# Patient Record
Sex: Female | Born: 1945 | Race: Black or African American | Hispanic: No | Smoking: Never smoker
Health system: Southern US, Community
[De-identification: ages and names within clinical notes are randomized; demographics above are authoritative.]

## PROBLEM LIST (undated history)

## (undated) DIAGNOSIS — D649 Anemia, unspecified: Secondary | ICD-10-CM

## (undated) DIAGNOSIS — T8859XA Other complications of anesthesia, initial encounter: Secondary | ICD-10-CM

## (undated) DIAGNOSIS — K219 Gastro-esophageal reflux disease without esophagitis: Secondary | ICD-10-CM

## (undated) DIAGNOSIS — I10 Essential (primary) hypertension: Secondary | ICD-10-CM

## (undated) DIAGNOSIS — R011 Cardiac murmur, unspecified: Secondary | ICD-10-CM

## (undated) DIAGNOSIS — Z9889 Other specified postprocedural states: Secondary | ICD-10-CM

## (undated) DIAGNOSIS — Z8489 Family history of other specified conditions: Secondary | ICD-10-CM

## (undated) DIAGNOSIS — T4145XA Adverse effect of unspecified anesthetic, initial encounter: Secondary | ICD-10-CM

## (undated) DIAGNOSIS — R112 Nausea with vomiting, unspecified: Secondary | ICD-10-CM

## (undated) HISTORY — PX: KNEE ARTHROPLASTY: SHX992

## (undated) HISTORY — PX: COLONOSCOPY: SHX174

## (undated) HISTORY — PX: HAND SURGERY: SHX662

## (undated) HISTORY — PX: SHOULDER SURGERY: SHX246

---

## 2010-10-08 ENCOUNTER — Encounter (HOSPITAL_COMMUNITY)
Admission: RE | Admit: 2010-10-08 | Discharge: 2010-10-08 | Disposition: A | Discharge status: Home / Self Care | Payer: PRIVATE HEALTH INSURANCE | Source: Ambulatory Visit | Attending: Orthopedic Surgery | Admitting: Orthopedic Surgery

## 2010-10-08 ENCOUNTER — Ambulatory Visit (HOSPITAL_COMMUNITY)
Admission: RE | Admit: 2010-10-08 | Discharge: 2010-10-08 | Disposition: A | Discharge status: Home / Self Care | Payer: PRIVATE HEALTH INSURANCE | Source: Ambulatory Visit | Attending: Orthopedic Surgery | Admitting: Orthopedic Surgery

## 2010-10-08 ENCOUNTER — Other Ambulatory Visit (HOSPITAL_COMMUNITY): Payer: Self-pay | Admitting: Orthopedic Surgery

## 2010-10-08 DIAGNOSIS — M1711 Unilateral primary osteoarthritis, right knee: Secondary | ICD-10-CM

## 2010-10-08 DIAGNOSIS — Z01818 Encounter for other preprocedural examination: Secondary | ICD-10-CM | POA: Insufficient documentation

## 2010-10-08 DIAGNOSIS — IMO0002 Reserved for concepts with insufficient information to code with codable children: Secondary | ICD-10-CM | POA: Insufficient documentation

## 2010-10-08 DIAGNOSIS — M1710 Unilateral primary osteoarthritis, unspecified knee: Secondary | ICD-10-CM | POA: Insufficient documentation

## 2010-10-08 DIAGNOSIS — Z01812 Encounter for preprocedural laboratory examination: Secondary | ICD-10-CM | POA: Insufficient documentation

## 2010-10-08 DIAGNOSIS — Z01810 Encounter for preprocedural cardiovascular examination: Secondary | ICD-10-CM | POA: Insufficient documentation

## 2010-10-08 DIAGNOSIS — K449 Diaphragmatic hernia without obstruction or gangrene: Secondary | ICD-10-CM | POA: Insufficient documentation

## 2010-10-08 LAB — SURGICAL PCR SCREEN
MRSA, PCR: NEGATIVE
Staphylococcus aureus: NEGATIVE

## 2010-10-08 LAB — COMPREHENSIVE METABOLIC PANEL
BUN: 16 mg/dL (ref 6–23)
Calcium: 9.5 mg/dL (ref 8.4–10.5)
Creatinine, Ser: 0.84 mg/dL (ref 0.50–1.10)
GFR calc Af Amer: >60 mL/min (ref >60)
Glucose, Bld: 102 mg/dL — ABNORMAL HIGH (ref 70–99)
Sodium: 136 mEq/L (ref 135–145)
Total Protein: 7.4 g/dL (ref 6.0–8.3)

## 2010-10-08 LAB — URINALYSIS, ROUTINE W REFLEX MICROSCOPIC
Nitrite: POSITIVE — AB
Specific Gravity, Urine: 1.014 (ref 1.005–1.030)
Urobilinogen, UA: 1 mg/dL (ref 0.0–1.0)

## 2010-10-08 LAB — ABO/RH: ABO/RH(D): A POS

## 2010-10-08 LAB — CBC
HCT: 38.1 % (ref 36.0–46.0)
Hemoglobin: 13.1 g/dL (ref 12.0–15.0)
MCHC: 34.4 g/dL (ref 30.0–36.0)
RBC: 4.7 MIL/uL (ref 3.87–5.11)
WBC: 4.9 10*3/uL (ref 4.0–10.5)

## 2010-10-08 LAB — PROTIME-INR: Prothrombin Time: 13 seconds (ref 11.6–15.2)

## 2010-10-08 LAB — DIFFERENTIAL
Basophils Absolute: 0 10*3/uL (ref 0.0–0.1)
Basophils Relative: 1 % (ref 0–1)
Lymphocytes Relative: 38 % (ref 12–46)
Monocytes Absolute: 0.3 10*3/uL (ref 0.1–1.0)
Neutro Abs: 2.5 10*3/uL (ref 1.7–7.7)
Neutrophils Relative %: 52 % (ref 43–77)

## 2010-10-08 LAB — URINE MICROSCOPIC-ADD ON

## 2010-10-10 LAB — URINE CULTURE

## 2010-10-12 ENCOUNTER — Inpatient Hospital Stay (HOSPITAL_COMMUNITY)
Admission: RE | Admit: 2010-10-12 | Discharge: 2010-10-14 | DRG: 470 | Disposition: A | Discharge status: Home Health | Payer: PRIVATE HEALTH INSURANCE | Source: Ambulatory Visit | Attending: Orthopedic Surgery | Admitting: Orthopedic Surgery

## 2010-10-12 DIAGNOSIS — M1710 Unilateral primary osteoarthritis, unspecified knee: Principal | ICD-10-CM | POA: Diagnosis present

## 2010-10-12 DIAGNOSIS — K219 Gastro-esophageal reflux disease without esophagitis: Secondary | ICD-10-CM | POA: Diagnosis present

## 2010-10-12 DIAGNOSIS — D62 Acute posthemorrhagic anemia: Secondary | ICD-10-CM | POA: Diagnosis not present

## 2010-10-12 DIAGNOSIS — Z01810 Encounter for preprocedural cardiovascular examination: Secondary | ICD-10-CM

## 2010-10-12 DIAGNOSIS — Z01812 Encounter for preprocedural laboratory examination: Secondary | ICD-10-CM

## 2010-10-12 DIAGNOSIS — I10 Essential (primary) hypertension: Secondary | ICD-10-CM | POA: Diagnosis present

## 2010-10-13 LAB — CBC
HCT: 29.8 % — ABNORMAL LOW (ref 36.0–46.0)
MCV: 80.3 fL (ref 78.0–100.0)
RBC: 3.71 MIL/uL — ABNORMAL LOW (ref 3.87–5.11)
WBC: 8.5 10*3/uL (ref 4.0–10.5)

## 2010-10-13 LAB — BASIC METABOLIC PANEL
BUN: 16 mg/dL (ref 6–23)
CO2: 26 mEq/L (ref 19–32)
Chloride: 101 mEq/L (ref 96–112)
Creatinine, Ser: 0.71 mg/dL (ref 0.50–1.10)
Potassium: 3.1 mEq/L — ABNORMAL LOW (ref 3.5–5.1)

## 2010-10-14 LAB — BASIC METABOLIC PANEL
BUN: 12 mg/dL (ref 6–23)
Chloride: 104 mEq/L (ref 96–112)
Creatinine, Ser: 0.83 mg/dL (ref 0.50–1.10)
GFR calc Af Amer: >60 mL/min (ref >60)
Glucose, Bld: 127 mg/dL — ABNORMAL HIGH (ref 70–99)

## 2010-10-14 LAB — CBC
HCT: 27.9 % — ABNORMAL LOW (ref 36.0–46.0)
Hemoglobin: 9.5 g/dL — ABNORMAL LOW (ref 12.0–15.0)
MCV: 81.8 fL (ref 78.0–100.0)
RDW: 13.5 % (ref 11.5–15.5)
WBC: 7.7 10*3/uL (ref 4.0–10.5)

## 2010-10-15 LAB — CROSSMATCH
ABO/RH(D): A POS
Antibody Screen: NEGATIVE
Unit division: 0
Unit division: 0

## 2010-10-28 NOTE — Op Note (Signed)
  NAMEARDA, Angelica Evans                ACCOUNT NO.:  [id number]  MEDICAL RECORD NO.:  [id number]  LOCATION:  5008                         FACILITY:  [facility]  PHYSICIAN:  Mila Homer. Sherlean Foot, M.D. DATE OF BIRTH:  [birth year]  DATE OF PROCEDURE:  10/12/2010 DATE OF DISCHARGE:                              OPERATIVE REPORT   SURGEON:  Mila Homer. Sherlean Foot, MD  ASSISTANT: 1. Altamese Cabal, PA-C 2. Skip Mayer, PA-C  ANESTHESIA:  General.  PREOPERATIVE DIAGNOSIS:  Right knee osteoarthritis.  POSTOPERATIVE DIAGNOSIS:  Right total knee osteoarthritis.  PROCEDURE:  Right total knee arthroplasty.  INDICATIONS FOR PROCEDURE:  The patient is a 65 year old white female with failure of conservative measures for osteoarthritis of the right knee.  Informed consent was obtained.  DESCRIPTION OF PROCEDURE:  The patient was laid supine and administered general anesthesia.  Right knee was prepped and draped in usual sterile fashion.  The extremity was exsanguinated with the Esmarch and tourniquet inflated to 350 mmHg and set for an hour.  A midline incision was made with a #10 blade.  New blade was used to make a median parapatellar arthrotomy and perform a synovectomy.  I elevated the deep MCL off the medial crest of the tibia.  I everted the patella that measured 23 mm thick.  I reamed down 8.5-mm drilled through lug holes through the 2-mm template with the prosthetic trial in place, I recreated the 22-mm thickness.  I then went into flexion subluxing the patella laterally.  I used the extramedullary alignment system on the tibia to make a perpendicular cut and 3 degrees of posterior slope.  I used the intramedullary system on the femur to make a 6-degree valgus cut.  I then marked out the epicondylar axis, measured 3 degrees.  I sized to size E pinned through the 3 degrees external rotation holes and made the anterior-posterior and chamfer cuts with the sagittal saw.  I then placed a lamina  spreader in the knee of the ACL, PCL, medial lateral menisci, and posterior condylar osteophytes.  I then placed a 10- mm spacer block and had good flexion/extension gap balance.  I then finished with a size 3 finishing block, and a size 4 tibial tray drilling keel.  I then trialed with a E femur, 4 tibia, 10 insert, 32 patella, and had good flexion/extension gap balance and good patellar tracking.  I then copiously irrigated with the components removed and then cemented in the components removing all excess cement and allowing the cement to harden in extension.  I then let the tourniquet down, obtained hemostasis.  I closed the arthrotomy with figure-of-eight #1 Vicryl sutures, buried 0 Vicryl sutures, subcuticular 2-0 Vicryl stitches, and skin staples.  Dressed with Xeroform dressing, sponges, sterile Webril, and Ace wrap.  COMPLICATIONS:  None.  DRAINS:  None.          ______________________________ Mila Homer. Sherlean Foot, M.D.     SDL/MEDQ  D:  10/12/2010  T:  10/13/2010  Job:  [id number]  Electronically Signed by Georgena Spurling M.D. on 10/28/2010 08:08:53 AM

## 2014-06-04 ENCOUNTER — Other Ambulatory Visit: Payer: Self-pay | Admitting: Orthopedic Surgery

## 2014-06-10 ENCOUNTER — Encounter (HOSPITAL_COMMUNITY): Payer: Self-pay | Admitting: Pharmacy Technician

## 2014-06-11 NOTE — Pre-Procedure Instructions (Signed)
Angelica Evans  06/11/2014   Your procedure is scheduled on:  Monday June 24, 2014 at 7:30 AM.  [facility] at 5:30 AM.  Call this number if you have problems the morning of surgery: [phone]   Remember:   Do not eat food or drink liquids after midnight.   Take these medicines the morning of surgery with A SIP OF WATER: Amlodipine (Norvasc), Nebivolol (Bystolic), Omeprazole (Prilosec), and Promethazine (Phenergan) if needed   Please stop taking any vitamins, herbal medications, Omega 3, Ibuprofen, Motrin, Alleve, etc on Monday March 21st   Do not wear jewelry, make-up or nail polish.  Do not wear lotions, powders, or perfumes.   Do not shave 48 hours prior to surgery.   Do not bring valuables to the hospital.  [facility] is not responsible for any belongings or valuables.               Contacts, dentures or bridgework may not be worn into surgery.  Leave suitcase in the car. After surgery it may be brought to your room.  For patients admitted to the hospital, discharge time is determined by your treatment team.               Patients discharged the day of surgery will not be allowed to drive home.  Name and phone number of your driver:   Special Instructions: Shower using CHG soap the night before and the morning of your surgery   Please read over the following fact sheets that you were given: Pain Booklet, Coughing and Deep Breathing, Total Joint Packet, MRSA Information and Surgical Site Infection Prevention

## 2014-06-12 ENCOUNTER — Ambulatory Visit (HOSPITAL_COMMUNITY)
Admission: RE | Admit: 2014-06-12 | Discharge: 2014-06-12 | Disposition: A | Discharge status: Home / Self Care | Payer: Medicare Other | Source: Ambulatory Visit | Attending: Orthopedic Surgery | Admitting: Orthopedic Surgery

## 2014-06-12 ENCOUNTER — Encounter (HOSPITAL_COMMUNITY)
Admission: RE | Admit: 2014-06-12 | Discharge: 2014-06-12 | Disposition: A | Discharge status: Home / Self Care | Payer: Medicare Other | Source: Ambulatory Visit | Attending: Orthopedic Surgery | Admitting: Orthopedic Surgery

## 2014-06-12 ENCOUNTER — Encounter (HOSPITAL_COMMUNITY): Payer: Self-pay

## 2014-06-12 DIAGNOSIS — M1712 Unilateral primary osteoarthritis, left knee: Secondary | ICD-10-CM | POA: Diagnosis not present

## 2014-06-12 DIAGNOSIS — Z01812 Encounter for preprocedural laboratory examination: Secondary | ICD-10-CM | POA: Insufficient documentation

## 2014-06-12 DIAGNOSIS — M5134 Other intervertebral disc degeneration, thoracic region: Secondary | ICD-10-CM | POA: Insufficient documentation

## 2014-06-12 DIAGNOSIS — Z01818 Encounter for other preprocedural examination: Secondary | ICD-10-CM

## 2014-06-12 HISTORY — DX: Gastro-esophageal reflux disease without esophagitis: K21.9

## 2014-06-12 HISTORY — DX: Cardiac murmur, unspecified: R01.1

## 2014-06-12 HISTORY — DX: Nausea with vomiting, unspecified: R11.2

## 2014-06-12 HISTORY — DX: Family history of other specified conditions: Z84.89

## 2014-06-12 HISTORY — DX: Essential (primary) hypertension: I10

## 2014-06-12 HISTORY — DX: Other complications of anesthesia, initial encounter: T88.59XA

## 2014-06-12 HISTORY — DX: Anemia, unspecified: D64.9

## 2014-06-12 HISTORY — DX: Other specified postprocedural states: Z98.890

## 2014-06-12 HISTORY — DX: Adverse effect of unspecified anesthetic, initial encounter: T41.45XA

## 2014-06-12 LAB — APTT: APTT: 27 s (ref 24–37)

## 2014-06-12 LAB — CBC WITH DIFFERENTIAL/PLATELET
Basophils Absolute: 0.1 10*3/uL (ref 0.0–0.1)
Basophils Relative: 1 % (ref 0–1)
EOS PCT: 5 % (ref 0–5)
Eosinophils Absolute: 0.3 10*3/uL (ref 0.0–0.7)
HEMATOCRIT: 37 % (ref 36.0–46.0)
Hemoglobin: 12.1 g/dL (ref 12.0–15.0)
LYMPHS ABS: 2.1 10*3/uL (ref 0.7–4.0)
Lymphocytes Relative: 31 % (ref 12–46)
MCH: 27.6 pg (ref 26.0–34.0)
MCHC: 32.7 g/dL (ref 30.0–36.0)
MCV: 84.3 fL (ref 78.0–100.0)
Monocytes Absolute: 0.5 10*3/uL (ref 0.1–1.0)
Monocytes Relative: 8 % (ref 3–12)
NEUTROS PCT: 55 % (ref 43–77)
Neutro Abs: 3.7 10*3/uL (ref 1.7–7.7)
Platelets: 307 10*3/uL (ref 150–400)
RBC: 4.39 MIL/uL (ref 3.87–5.11)
RDW: 13.7 % (ref 11.5–15.5)
WBC: 6.7 10*3/uL (ref 4.0–10.5)

## 2014-06-12 LAB — COMPREHENSIVE METABOLIC PANEL
ALK PHOS: 76 U/L (ref 39–117)
ALT: 19 U/L (ref 0–35)
ANION GAP: 9 (ref 5–15)
AST: 21 U/L (ref 0–37)
Albumin: 4 g/dL (ref 3.5–5.2)
BILIRUBIN TOTAL: 0.6 mg/dL (ref 0.3–1.2)
BUN: 16 mg/dL (ref 6–23)
CO2: 29 mmol/L (ref 19–32)
Calcium: 10 mg/dL (ref 8.4–10.5)
Chloride: 101 mmol/L (ref 96–112)
Creatinine, Ser: 0.97 mg/dL (ref 0.50–1.10)
GFR, EST AFRICAN AMERICAN: 68 mL/min — AB (ref >90)
GFR, EST NON AFRICAN AMERICAN: 59 mL/min — AB (ref >90)
GLUCOSE: 100 mg/dL — AB (ref 70–99)
POTASSIUM: 3.8 mmol/L (ref 3.5–5.1)
Sodium: 139 mmol/L (ref 135–145)
TOTAL PROTEIN: 7.3 g/dL (ref 6.0–8.3)

## 2014-06-12 LAB — URINALYSIS, ROUTINE W REFLEX MICROSCOPIC
Bilirubin Urine: NEGATIVE
Glucose, UA: NEGATIVE mg/dL
Hgb urine dipstick: NEGATIVE
Ketones, ur: NEGATIVE mg/dL
NITRITE: NEGATIVE
PH: 6 (ref 5.0–8.0)
Protein, ur: NEGATIVE mg/dL
SPECIFIC GRAVITY, URINE: 1.023 (ref 1.005–1.030)
UROBILINOGEN UA: 0.2 mg/dL (ref 0.0–1.0)

## 2014-06-12 LAB — PROTIME-INR
INR: 0.97 (ref 0.00–1.49)
Prothrombin Time: 13 seconds (ref 11.6–15.2)

## 2014-06-12 LAB — URINE MICROSCOPIC-ADD ON

## 2014-06-12 LAB — SURGICAL PCR SCREEN
MRSA, PCR: NEGATIVE
STAPHYLOCOCCUS AUREUS: NEGATIVE

## 2014-06-12 NOTE — Progress Notes (Signed)
Patient denied having any acute cardiac or pulmonary issues. PCP is Kirstie PeriAshish Shah in [place]. Office number [phone].   Patient denied having attended the joint class. Nurse suggested that patient watch 20 minute joint video. Patient sighed and stated "do I really need to watch that because I already had my other knee replaced?" Nurse asked patient if she had access to the Internet and she stated "no." Nurse then provided patient with joint booklet. Patient appeared to be satisfied.

## 2014-06-20 NOTE — Progress Notes (Signed)
Notified patient to arrive at 730 am 06-24-14.

## 2014-06-21 MED ORDER — BUPIVACAINE LIPOSOME 1.3 % IJ SUSP
20.0000 mL | Freq: Once | INTRAMUSCULAR | Status: DC
  Filled 2014-06-21: qty 20

## 2014-06-21 MED ORDER — TRANEXAMIC ACID 100 MG/ML IV SOLN
1000.0000 mg | INTRAVENOUS | Status: AC
  Filled 2014-06-21 (×2): qty 10

## 2014-06-23 MED ORDER — CEFAZOLIN SODIUM-DEXTROSE 2-3 GM-% IV SOLR
2.0000 g | INTRAVENOUS | Status: AC
  Administered 2014-06-24: 2 g via INTRAVENOUS
  Filled 2014-06-23: qty 50

## 2014-06-23 MED ORDER — SODIUM CHLORIDE 0.9 % IV SOLN: INTRAVENOUS | Status: DC

## 2014-06-23 NOTE — Anesthesia Preprocedure Evaluation (Addendum)
Anesthesia Evaluation  Patient identified by MRN, date of birth, ID band Patient awake    History of Anesthesia Complications (+) PONV  Airway Mallampati: II   Neck ROM: Full    Dental  (+) Upper Dentures, Partial Lower, Dental Advisory Given   Pulmonary neg pulmonary ROS,  breath sounds clear to auscultation        Cardiovascular hypertension, Pt. on medications  EKG 05/2014 OK   Neuro/Psych negative neurological ROS  negative psych ROS   GI/Hepatic Neg liver ROS, GERD-  Medicated,  Endo/Other    Renal/GU negative Renal ROS     Musculoskeletal   Abdominal (+)  Abdomen: soft.    Peds  Hematology 12/37   Anesthesia Other Findings   Reproductive/Obstetrics                           Anesthesia Physical Anesthesia Plan  ASA: II  Anesthesia Plan: General and Spinal   Post-op Pain Management: MAC Combined w/ Regional for Post-op pain   Induction:   Airway Management Planned:   Additional Equipment:   Intra-op Plan:   Post-operative Plan:   Informed Consent:   Plan Discussed with:   Anesthesia Plan Comments: (Will offer spinal and GA, also AC block if wanted)        Anesthesia Quick Evaluation

## 2014-06-24 ENCOUNTER — Encounter (HOSPITAL_COMMUNITY)
Admission: RE | Disposition: A | Discharge status: Home Health | Payer: Self-pay | Source: Ambulatory Visit | Attending: Orthopedic Surgery

## 2014-06-24 ENCOUNTER — Inpatient Hospital Stay (HOSPITAL_COMMUNITY)
Admission: RE | Admit: 2014-06-24 | Discharge: 2014-06-25 | DRG: 470 | Disposition: A | Discharge status: Home Health | Payer: Medicare Other | Source: Ambulatory Visit | Attending: Orthopedic Surgery | Admitting: Orthopedic Surgery

## 2014-06-24 ENCOUNTER — Encounter (HOSPITAL_COMMUNITY): Payer: Self-pay | Admitting: Anesthesiology

## 2014-06-24 ENCOUNTER — Inpatient Hospital Stay (HOSPITAL_COMMUNITY): Payer: Medicare Other | Admitting: Anesthesiology

## 2014-06-24 DIAGNOSIS — I10 Essential (primary) hypertension: Secondary | ICD-10-CM | POA: Diagnosis present

## 2014-06-24 DIAGNOSIS — Z7901 Long term (current) use of anticoagulants: Secondary | ICD-10-CM

## 2014-06-24 DIAGNOSIS — K219 Gastro-esophageal reflux disease without esophagitis: Secondary | ICD-10-CM | POA: Diagnosis present

## 2014-06-24 DIAGNOSIS — Z7982 Long term (current) use of aspirin: Secondary | ICD-10-CM

## 2014-06-24 DIAGNOSIS — M1712 Unilateral primary osteoarthritis, left knee: Secondary | ICD-10-CM | POA: Diagnosis present

## 2014-06-24 DIAGNOSIS — Z79899 Other long term (current) drug therapy: Secondary | ICD-10-CM

## 2014-06-24 DIAGNOSIS — D62 Acute posthemorrhagic anemia: Secondary | ICD-10-CM | POA: Diagnosis not present

## 2014-06-24 DIAGNOSIS — M25562 Pain in left knee: Secondary | ICD-10-CM | POA: Diagnosis present

## 2014-06-24 DIAGNOSIS — Z96659 Presence of unspecified artificial knee joint: Secondary | ICD-10-CM

## 2014-06-24 HISTORY — PX: TOTAL KNEE ARTHROPLASTY: SHX125

## 2014-06-24 LAB — CREATININE, SERUM
CREATININE: 1.06 mg/dL (ref 0.50–1.10)
GFR calc Af Amer: 61 mL/min — ABNORMAL LOW (ref >90)
GFR calc non Af Amer: 53 mL/min — ABNORMAL LOW (ref >90)

## 2014-06-24 LAB — CBC
HCT: 37.6 % (ref 36.0–46.0)
Hemoglobin: 12.1 g/dL (ref 12.0–15.0)
MCH: 26.8 pg (ref 26.0–34.0)
MCHC: 32.2 g/dL (ref 30.0–36.0)
MCV: 83.4 fL (ref 78.0–100.0)
Platelets: 213 10*3/uL (ref 150–400)
RBC: 4.51 MIL/uL (ref 3.87–5.11)
RDW: 13.6 % (ref 11.5–15.5)
WBC: 10 10*3/uL (ref 4.0–10.5)

## 2014-06-24 SURGERY — ARTHROPLASTY, KNEE, TOTAL
Anesthesia: General | Site: Knee | Laterality: Left

## 2014-06-24 MED ORDER — LACTATED RINGERS IV SOLN
INTRAVENOUS | Status: DC
  Administered 2014-06-24: 09:00:00 via INTRAVENOUS

## 2014-06-24 MED ORDER — NEBIVOLOL HCL 5 MG PO TABS
5.0000 mg | ORAL_TABLET | Freq: Every day | ORAL | Status: DC
  Administered 2014-06-25: 5 mg via ORAL
  Filled 2014-06-24: qty 1

## 2014-06-24 MED ORDER — 0.9 % SODIUM CHLORIDE (POUR BTL) OPTIME
TOPICAL | Status: DC | PRN
  Administered 2014-06-24: 1000 mL

## 2014-06-24 MED ORDER — ZOLPIDEM TARTRATE 5 MG PO TABS: 5.0000 mg | ORAL_TABLET | Freq: Every evening | ORAL | Status: DC | PRN

## 2014-06-24 MED ORDER — CHLORHEXIDINE GLUCONATE 4 % EX LIQD
60.0000 mL | Freq: Once | CUTANEOUS | Status: DC
  Filled 2014-06-24: qty 60

## 2014-06-24 MED ORDER — ONDANSETRON HCL 4 MG/2ML IJ SOLN: 4.0000 mg | Freq: Four times a day (QID) | INTRAMUSCULAR | Status: DC | PRN

## 2014-06-24 MED ORDER — DEXAMETHASONE SODIUM PHOSPHATE 4 MG/ML IJ SOLN: INTRAMUSCULAR | Status: DC | PRN

## 2014-06-24 MED ORDER — WHITE PETROLATUM GEL
Status: AC
  Administered 2014-06-24: 22:00:00
  Filled 2014-06-24: qty 1

## 2014-06-24 MED ORDER — ONDANSETRON HCL 4 MG PO TABS: 4.0000 mg | ORAL_TABLET | Freq: Four times a day (QID) | ORAL | Status: DC | PRN

## 2014-06-24 MED ORDER — POTASSIUM CHLORIDE ER 10 MEQ PO TBCR: 10.0000 meq | EXTENDED_RELEASE_TABLET | Freq: Once | ORAL | Status: DC

## 2014-06-24 MED ORDER — PHENYLEPHRINE 40 MCG/ML (10ML) SYRINGE FOR IV PUSH (FOR BLOOD PRESSURE SUPPORT)
PREFILLED_SYRINGE | INTRAVENOUS | Status: AC
  Filled 2014-06-24: qty 10

## 2014-06-24 MED ORDER — ACETAMINOPHEN 325 MG PO TABS: 650.0000 mg | ORAL_TABLET | Freq: Four times a day (QID) | ORAL | Status: DC | PRN

## 2014-06-24 MED ORDER — PANTOPRAZOLE SODIUM 40 MG PO TBEC
40.0000 mg | DELAYED_RELEASE_TABLET | Freq: Every day | ORAL | Status: DC
  Administered 2014-06-25: 40 mg via ORAL
  Filled 2014-06-24: qty 1

## 2014-06-24 MED ORDER — FENTANYL CITRATE 0.05 MG/ML IJ SOLN
25.0000 ug | INTRAMUSCULAR | Status: DC | PRN
  Administered 2014-06-24 (×3): 25 ug via INTRAVENOUS

## 2014-06-24 MED ORDER — DIPHENHYDRAMINE HCL 12.5 MG/5ML PO ELIX
12.5000 mg | ORAL_SOLUTION | ORAL | Status: DC | PRN
  Administered 2014-06-24: 25 mg via ORAL
  Filled 2014-06-24: qty 10

## 2014-06-24 MED ORDER — SODIUM CHLORIDE 0.9 % IR SOLN
Status: DC | PRN
  Administered 2014-06-24: 1000 mL

## 2014-06-24 MED ORDER — HYDROMORPHONE HCL 1 MG/ML IJ SOLN
1.0000 mg | INTRAMUSCULAR | Status: DC | PRN
  Administered 2014-06-24 – 2014-06-25 (×2): 1 mg via INTRAVENOUS
  Filled 2014-06-24 (×2): qty 1

## 2014-06-24 MED ORDER — DEXAMETHASONE SODIUM PHOSPHATE 4 MG/ML IJ SOLN
INTRAMUSCULAR | Status: AC
  Filled 2014-06-24: qty 1

## 2014-06-24 MED ORDER — FENTANYL CITRATE 0.05 MG/ML IJ SOLN
INTRAMUSCULAR | Status: AC
  Filled 2014-06-24: qty 2

## 2014-06-24 MED ORDER — LIDOCAINE HCL (CARDIAC) 20 MG/ML IV SOLN
INTRAVENOUS | Status: DC | PRN
  Administered 2014-06-24: 100 mg via INTRAVENOUS

## 2014-06-24 MED ORDER — ACETAMINOPHEN 10 MG/ML IV SOLN
INTRAVENOUS | Status: AC
  Filled 2014-06-24: qty 100

## 2014-06-24 MED ORDER — DEXAMETHASONE SODIUM PHOSPHATE 4 MG/ML IJ SOLN
INTRAMUSCULAR | Status: DC | PRN
  Administered 2014-06-24: 4 mg via INTRAVENOUS

## 2014-06-24 MED ORDER — OXYCODONE HCL ER 10 MG PO T12A
10.0000 mg | EXTENDED_RELEASE_TABLET | Freq: Two times a day (BID) | ORAL | Status: DC
  Administered 2014-06-24 – 2014-06-25 (×2): 10 mg via ORAL
  Filled 2014-06-24 (×2): qty 1

## 2014-06-24 MED ORDER — BUPIVACAINE LIPOSOME 1.3 % IJ SUSP
INTRAMUSCULAR | Status: DC | PRN
Start: 2014-06-24 — End: 2014-06-24
  Administered 2014-06-24: 20 mL

## 2014-06-24 MED ORDER — ARTIFICIAL TEARS OP OINT
TOPICAL_OINTMENT | OPHTHALMIC | Status: DC | PRN
  Administered 2014-06-24: 1 via OPHTHALMIC

## 2014-06-24 MED ORDER — SCOPOLAMINE 1 MG/3DAYS TD PT72SCOPOLAMINE 1 MG/3DAYS
1.0000 | MEDICATED_PATCH | TRANSDERMAL | Status: DC
Start: 2014-06-24 — End: 2014-06-24
  Administered 2014-06-24: 1.5 mg via TRANSDERMAL
  Filled 2014-06-24: qty 1

## 2014-06-24 MED ORDER — CELECOXIB 200 MG PO CAPS
200.0000 mg | ORAL_CAPSULE | Freq: Two times a day (BID) | ORAL | Status: DC
  Administered 2014-06-24 – 2014-06-25 (×2): 200 mg via ORAL
  Filled 2014-06-24 (×2): qty 1

## 2014-06-24 MED ORDER — ARTIFICIAL TEARS OP OINT
TOPICAL_OINTMENT | OPHTHALMIC | Status: AC
  Filled 2014-06-24: qty 3.5

## 2014-06-24 MED ORDER — LISINOPRIL-HYDROCHLOROTHIAZIDE 20-12.5 MG PO TABS: 2.0000 | ORAL_TABLET | Freq: Every day | ORAL | Status: DC

## 2014-06-24 MED ORDER — SENNOSIDES-DOCUSATE SODIUM 8.6-50 MG PO TABS: 1.0000 | ORAL_TABLET | Freq: Every evening | ORAL | Status: DC | PRN

## 2014-06-24 MED ORDER — ACETAMINOPHEN 650 MG RE SUPP: 650.0000 mg | Freq: Four times a day (QID) | RECTAL | Status: DC | PRN

## 2014-06-24 MED ORDER — METHOCARBAMOL 1000 MG/10ML IJ SOLN
500.0000 mg | Freq: Four times a day (QID) | INTRAMUSCULAR | Status: DC | PRN
  Administered 2014-06-24: 500 mg via INTRAVENOUS
  Filled 2014-06-24 (×2): qty 5

## 2014-06-24 MED ORDER — METOCLOPRAMIDE HCL 5 MG PO TABS: 5.0000 mg | ORAL_TABLET | Freq: Three times a day (TID) | ORAL | Status: DC | PRN

## 2014-06-24 MED ORDER — GLYCOPYRROLATE 0.2 MG/ML IJ SOLN
INTRAMUSCULAR | Status: DC | PRN
  Administered 2014-06-24: 0.4 mg via INTRAVENOUS

## 2014-06-24 MED ORDER — FENTANYL CITRATE 0.05 MG/ML IJ SOLN
INTRAMUSCULAR | Status: DC | PRN
Start: 2014-06-24 — End: 2014-06-24
  Administered 2014-06-24 (×3): 50 ug via INTRAVENOUS

## 2014-06-24 MED ORDER — BISACODYL 5 MG PO TBEC: 5.0000 mg | DELAYED_RELEASE_TABLET | Freq: Every day | ORAL | Status: DC | PRN

## 2014-06-24 MED ORDER — ENOXAPARIN SODIUM 30 MG/0.3ML ~~LOC~~ SOLN
30.0000 mg | Freq: Two times a day (BID) | SUBCUTANEOUS | Status: DC
  Administered 2014-06-25: 30 mg via SUBCUTANEOUS
  Filled 2014-06-24: qty 0.3

## 2014-06-24 MED ORDER — TRANEXAMIC ACID 100 MG/ML IV SOLN
1000.0000 mg | INTRAVENOUS | Status: DC | PRN
  Administered 2014-06-24: 1000 mg via INTRAVENOUS

## 2014-06-24 MED ORDER — ONDANSETRON HCL 4 MG/2ML IJ SOLN
INTRAMUSCULAR | Status: DC | PRN
  Administered 2014-06-24: 4 mg via INTRAVENOUS

## 2014-06-24 MED ORDER — PROPOFOL 10 MG/ML IV BOLUS
INTRAVENOUS | Status: DC | PRN
  Administered 2014-06-24: 150 mg via INTRAVENOUS

## 2014-06-24 MED ORDER — NEOSTIGMINE METHYLSULFATE 10 MG/10ML IV SOLN
INTRAVENOUS | Status: DC | PRN
  Administered 2014-06-24: 3 mg via INTRAVENOUS

## 2014-06-24 MED ORDER — ACETAMINOPHEN 10 MG/ML IV SOLN
INTRAVENOUS | Status: DC | PRN
  Administered 2014-06-24: 1000 mg via INTRAVENOUS

## 2014-06-24 MED ORDER — OXYCODONE HCL ER 10 MG PO T12A: 10.0000 mg | EXTENDED_RELEASE_TABLET | Freq: Two times a day (BID) | ORAL | Status: DC

## 2014-06-24 MED ORDER — MENTHOL 3 MG MT LOZG: 1.0000 | LOZENGE | OROMUCOSAL | Status: DC | PRN

## 2014-06-24 MED ORDER — HYDROCHLOROTHIAZIDE 25 MG PO TABS
25.0000 mg | ORAL_TABLET | Freq: Every day | ORAL | Status: DC
  Administered 2014-06-25: 25 mg via ORAL
  Filled 2014-06-24: qty 1

## 2014-06-24 MED ORDER — METHOCARBAMOL 500 MG PO TABS
500.0000 mg | ORAL_TABLET | Freq: Four times a day (QID) | ORAL | Status: DC | PRN
  Administered 2014-06-24: 500 mg via ORAL
  Filled 2014-06-24: qty 1

## 2014-06-24 MED ORDER — PHENOL 1.4 % MT LIQD: 1.0000 | OROMUCOSAL | Status: DC | PRN

## 2014-06-24 MED ORDER — BUPIVACAINE-EPINEPHRINE 0.5% -1:200000 IJ SOLN
INTRAMUSCULAR | Status: DC | PRN
  Administered 2014-06-24: 30 mL

## 2014-06-24 MED ORDER — PROMETHAZINE HCL 25 MG/ML IJ SOLN: 6.2500 mg | INTRAMUSCULAR | Status: DC | PRN

## 2014-06-24 MED ORDER — BUMETANIDE 0.5 MG PO TABS
0.5000 mg | ORAL_TABLET | Freq: Every day | ORAL | Status: DC | PRN
  Filled 2014-06-24: qty 1

## 2014-06-24 MED ORDER — CELECOXIB 200 MG PO CAPS: 200.0000 mg | ORAL_CAPSULE | Freq: Two times a day (BID) | ORAL | Status: DC

## 2014-06-24 MED ORDER — AMLODIPINE BESYLATE 5 MG PO TABS
5.0000 mg | ORAL_TABLET | Freq: Every day | ORAL | Status: DC
  Administered 2014-06-25: 5 mg via ORAL
  Filled 2014-06-24: qty 1

## 2014-06-24 MED ORDER — ALUM & MAG HYDROXIDE-SIMETH 200-200-20 MG/5ML PO SUSP
30.0000 mL | ORAL | Status: DC | PRN
  Administered 2014-06-24 – 2014-06-25 (×2): 30 mL via ORAL
  Filled 2014-06-24 (×2): qty 30

## 2014-06-24 MED ORDER — METOCLOPRAMIDE HCL 5 MG/ML IJ SOLN: 5.0000 mg | Freq: Three times a day (TID) | INTRAMUSCULAR | Status: DC | PRN

## 2014-06-24 MED ORDER — OXYCODONE HCL 5 MG PO TABS
5.0000 mg | ORAL_TABLET | ORAL | Status: DC | PRN
  Administered 2014-06-24 – 2014-06-25 (×4): 10 mg via ORAL
  Filled 2014-06-24 (×5): qty 2

## 2014-06-24 MED ORDER — SODIUM CHLORIDE 0.9 % IV SOLN: INTRAVENOUS | Status: DC

## 2014-06-24 MED ORDER — GLYCOPYRROLATE 0.2 MG/ML IJ SOLN
INTRAMUSCULAR | Status: AC
  Filled 2014-06-24: qty 3

## 2014-06-24 MED ORDER — DOCUSATE SODIUM 100 MG PO CAPS
100.0000 mg | ORAL_CAPSULE | Freq: Two times a day (BID) | ORAL | Status: DC
  Administered 2014-06-24 – 2014-06-25 (×2): 100 mg via ORAL
  Filled 2014-06-24 (×2): qty 1

## 2014-06-24 MED ORDER — LISINOPRIL 40 MG PO TABS
40.0000 mg | ORAL_TABLET | Freq: Every day | ORAL | Status: DC
  Administered 2014-06-25: 40 mg via ORAL

## 2014-06-24 MED ORDER — CEFAZOLIN SODIUM-DEXTROSE 2-3 GM-% IV SOLR
2.0000 g | Freq: Four times a day (QID) | INTRAVENOUS | Status: AC
  Administered 2014-06-24 (×2): 2 g via INTRAVENOUS
  Filled 2014-06-24 (×2): qty 50

## 2014-06-24 MED ORDER — FLEET ENEMA 7-19 GM/118ML RE ENEM: 1.0000 | ENEMA | Freq: Once | RECTAL | Status: AC | PRN

## 2014-06-24 MED ORDER — FENTANYL CITRATE 0.05 MG/ML IJ SOLN
INTRAMUSCULAR | Status: AC
  Filled 2014-06-24: qty 5

## 2014-06-24 MED ORDER — PROPOFOL 10 MG/ML IV BOLUS
INTRAVENOUS | Status: AC
  Filled 2014-06-24: qty 20

## 2014-06-24 MED ORDER — MEPERIDINE HCL 25 MG/ML IJ SOLN: 6.2500 mg | INTRAMUSCULAR | Status: DC | PRN

## 2014-06-24 MED ORDER — ROCURONIUM BROMIDE 100 MG/10ML IV SOLN
INTRAVENOUS | Status: DC | PRN
  Administered 2014-06-24: 40 mg via INTRAVENOUS

## 2014-06-24 MED ORDER — BUPIVACAINE-EPINEPHRINE (PF) 0.5% -1:200000 IJ SOLN
INTRAMUSCULAR | Status: DC | PRN
  Administered 2014-06-24: 30 mL via PERINEURAL

## 2014-06-24 MED ORDER — MIDAZOLAM HCL 2 MG/2ML IJ SOLN
INTRAMUSCULAR | Status: AC
  Filled 2014-06-24: qty 2

## 2014-06-24 MED ORDER — LACTATED RINGERS IV SOLN
INTRAVENOUS | Status: DC | PRN
  Administered 2014-06-24 (×2): via INTRAVENOUS

## 2014-06-24 MED ORDER — PHENYLEPHRINE HCL 10 MG/ML IJ SOLN
INTRAMUSCULAR | Status: DC | PRN
  Administered 2014-06-24: 40 ug via INTRAVENOUS
  Administered 2014-06-24 (×2): 120 ug via INTRAVENOUS
  Administered 2014-06-24: 80 ug via INTRAVENOUS
  Administered 2014-06-24: 40 ug via INTRAVENOUS

## 2014-06-24 SURGICAL SUPPLY — 58 items
BANDAGE ESMARK 6X9 LF (GAUZE/BANDAGES/DRESSINGS) ×1 IMPLANT
BLADE SAGITTAL 13X1.27X60 (BLADE) ×2 IMPLANT
BLADE SAGITTAL 13X1.27X60MM (BLADE) ×1
BLADE SAW SGTL 83.5X18.5 (BLADE) ×3 IMPLANT
BLADE SURG 10 STRL SS (BLADE) ×3 IMPLANT
BNDG ESMARK 6X9 LF (GAUZE/BANDAGES/DRESSINGS) ×3
BOWL SMART MIX CTS (DISPOSABLE) ×3 IMPLANT
CAPT KNEE TOTAL 3 ×3 IMPLANT
CEMENT BONE SIMPLEX SPEEDSET (Cement) ×6 IMPLANT
COVER SURGICAL LIGHT HANDLE (MISCELLANEOUS) ×3 IMPLANT
CUFF TOURNIQUET SINGLE 34IN LL (TOURNIQUET CUFF) ×3 IMPLANT
DRAPE EXTREMITY T 121X128X90 (DRAPE) ×3 IMPLANT
DRAPE IMP U-DRAPE 54X76 (DRAPES) ×3 IMPLANT
DRAPE INCISE IOBAN 66X45 STRL (DRAPES) ×6 IMPLANT
DRAPE PROXIMA HALF (DRAPES) IMPLANT
DRAPE U-SHAPE 47X51 STRL (DRAPES) ×3 IMPLANT
DRSG ADAPTIC 3X8 NADH LF (GAUZE/BANDAGES/DRESSINGS) ×3 IMPLANT
DRSG PAD ABDOMINAL 8X10 ST (GAUZE/BANDAGES/DRESSINGS) ×3 IMPLANT
DURAPREP 26ML APPLICATOR (WOUND CARE) ×6 IMPLANT
ELECT REM PT RETURN 9FT ADLT (ELECTROSURGICAL) ×3
ELECTRODE REM PT RTRN 9FT ADLT (ELECTROSURGICAL) ×1 IMPLANT
GAUZE SPONGE 4X4 12PLY STRL (GAUZE/BANDAGES/DRESSINGS) ×3 IMPLANT
GLOVE BIOGEL M 7.0 STRL (GLOVE) IMPLANT
GLOVE BIOGEL PI IND STRL 7.5 (GLOVE) IMPLANT
GLOVE BIOGEL PI IND STRL 8.5 (GLOVE) ×2 IMPLANT
GLOVE BIOGEL PI INDICATOR 7.5 (GLOVE)
GLOVE BIOGEL PI INDICATOR 8.5 (GLOVE) ×4
GLOVE SURG ORTHO 8.0 STRL STRW (GLOVE) ×12 IMPLANT
GOWN STRL REUS W/ TWL LRG LVL3 (GOWN DISPOSABLE) ×1 IMPLANT
GOWN STRL REUS W/ TWL XL LVL3 (GOWN DISPOSABLE) ×2 IMPLANT
GOWN STRL REUS W/TWL LRG LVL3 (GOWN DISPOSABLE) ×2
GOWN STRL REUS W/TWL XL LVL3 (GOWN DISPOSABLE) ×4
HANDPIECE INTERPULSE COAX TIP (DISPOSABLE) ×2
HOOD PEEL AWAY FACE SHEILD DIS (HOOD) ×9 IMPLANT
KIT BASIN OR (CUSTOM PROCEDURE TRAY) ×3 IMPLANT
KIT ROOM TURNOVER OR (KITS) ×3 IMPLANT
KNEE CAPITATED TOTAL 3 ×1 IMPLANT
MANIFOLD NEPTUNE II (INSTRUMENTS) ×3 IMPLANT
NEEDLE 22X1 1/2 (OR ONLY) (NEEDLE) ×6 IMPLANT
NS IRRIG 1000ML POUR BTL (IV SOLUTION) ×3 IMPLANT
PACK TOTAL JOINT (CUSTOM PROCEDURE TRAY) ×3 IMPLANT
PACK UNIVERSAL I (CUSTOM PROCEDURE TRAY) ×3 IMPLANT
PAD ARMBOARD 7.5X6 YLW CONV (MISCELLANEOUS) ×6 IMPLANT
PADDING CAST COTTON 6X4 STRL (CAST SUPPLIES) ×3 IMPLANT
SET HNDPC FAN SPRY TIP SCT (DISPOSABLE) ×1 IMPLANT
SPONGE GAUZE 4X4 12PLY STER LF (GAUZE/BANDAGES/DRESSINGS) ×3 IMPLANT
STAPLER VISISTAT 35W (STAPLE) ×3 IMPLANT
SUCTION FRAZIER TIP 10 FR DISP (SUCTIONS) ×3 IMPLANT
SUT BONE WAX W31G (SUTURE) ×3 IMPLANT
SUT VIC AB 0 CTB1 27 (SUTURE) ×6 IMPLANT
SUT VIC AB 1 CT1 27 (SUTURE) ×4
SUT VIC AB 1 CT1 27XBRD ANBCTR (SUTURE) ×2 IMPLANT
SUT VIC AB 2-0 CT1 27 (SUTURE) ×4
SUT VIC AB 2-0 CT1 TAPERPNT 27 (SUTURE) ×2 IMPLANT
SYR 20CC LL (SYRINGE) ×6 IMPLANT
TOWEL OR 17X24 6PK STRL BLUE (TOWEL DISPOSABLE) ×3 IMPLANT
TOWEL OR 17X26 10 PK STRL BLUE (TOWEL DISPOSABLE) ×3 IMPLANT
WATER STERILE IRR 1000ML POUR (IV SOLUTION) ×6 IMPLANT

## 2014-06-24 NOTE — Progress Notes (Signed)
Utilization review completed.  

## 2014-06-24 NOTE — Progress Notes (Signed)
Orthopedic Tech Progress Note Patient Details:  Willey BladeMary Laramie 21-Aug-1945 [id number]  CPM Left Knee CPM Left Knee: On Left Knee Flexion (Degrees): 90 Left Knee Extension (Degrees): 0 Additional Comments: ttrapeze bar patient helper Viewed order from doctor's order list  Nikki DomCrawford, Zarielle Cea 06/24/2014, 12:13 PM

## 2014-06-24 NOTE — Progress Notes (Addendum)
Originally charted on incorrect patient

## 2014-06-24 NOTE — H&P (Signed)
  Angelica BladeMary Evans MRN:  [id number] DOB/SEX:  1945/11/08/female  CHIEF COMPLAINT:  Painful left Knee  HISTORY: Patient is a 69 y.o. female presented with a history of pain in the left knee. Onset of symptoms was gradual starting several years ago with gradually worsening course since that time. Prior procedures on the knee include arthroscopy. Patient has been treated conservatively with over-the-counter NSAIDs and activity modification. Patient currently rates pain in the knee at 10 out of 10 with activity. There is pain at night.  PAST MEDICAL HISTORY: There are no active problems to display for this patient.  Past Medical History  Diagnosis Date  . Complication of anesthesia   . PONV (postoperative nausea and vomiting)   . Family history of adverse reaction to anesthesia     makes sister nauseated  . Hypertension   . Heart murmur   . GERD (gastroesophageal reflux disease)   . Anemia    Past Surgical History  Procedure Laterality Date  . Knee arthroplasty Right   . Shoulder surgery Left   . Hand surgery Bilateral   . Colonoscopy       MEDICATIONS:   No prescriptions prior to admission    ALLERGIES:  No Known Allergies  REVIEW OF SYSTEMS:  A comprehensive review of systems was negative.   FAMILY HISTORY:  History reviewed. No pertinent family history.  SOCIAL HISTORY:   History  Substance Use Topics  . Smoking status: Never Smoker   . Smokeless tobacco: Not on file  . Alcohol Use: No     EXAMINATION:  Vital signs in last 24 hours:    General appearance: alert, cooperative and no distress Lungs: clear to auscultation bilaterally Heart: regular rate and rhythm, S1, S2 normal, no murmur, click, rub or gallop Abdomen: soft, non-tender; bowel sounds normal; no masses,  no organomegaly Extremities: extremities normal, atraumatic, no cyanosis or edema and Homans sign is negative, no sign of DVT Pulses: 2+ and symmetric Skin: Skin color, texture, turgor normal. No  rashes or lesions Neurologic: Alert and oriented X 3, normal strength and tone. Normal symmetric reflexes. Normal coordination and gait  Musculoskeletal:  ROM 0-115, Ligaments intact,  Imaging Review Plain radiographs demonstrate severe degenerative joint disease of the left knee. The overall alignment is significant varus. The bone quality appears to be good for age and reported activity level.  Assessment/Plan: Primary osteoarthritis, left knee   The patient history, physical examination and imaging studies are consistent with advanced degenerative joint disease of the left knee. The patient has failed conservative treatment.  The clearance notes were reviewed.  After discussion with the patient it was felt that Total Knee Replacement was indicated. The procedure,  risks, and benefits of total knee arthroplasty were presented and reviewed. The risks including but not limited to aseptic loosening, infection, blood clots, vascular injury, stiffness, patella tracking problems complications among others were discussed. The patient acknowledged the explanation, agreed to proceed with the plan.  Angelica Evans 06/24/2014, 6:57 AM

## 2014-06-24 NOTE — Anesthesia Postprocedure Evaluation (Signed)
  Anesthesia Post-op Note  Patient: Angelica Evans  Procedure(s) Performed: Procedure(s): TOTAL KNEE ARTHROPLASTY (Left)  Patient Location: PACU  Anesthesia Type:General  Level of Consciousness: awake and alert   Airway and Oxygen Therapy: Patient Spontanous Breathing and Patient connected to nasal cannula oxygen  Post-op Pain: mild  Post-op Assessment: Post-op Vital signs reviewed and Patient's Cardiovascular Status Stable  Post-op Vital Signs: Reviewed and stable  Last Vitals:  Filed Vitals:   06/24/14 1136  BP: 120/61  Pulse: 77  Temp: 36.3 C  Resp: 17    Complications: No apparent anesthesia complications

## 2014-06-24 NOTE — Evaluation (Signed)
Physical Therapy Evaluation Patient Details Name: Angelica Evans Arth MRN: [id number] DOB: 1946-02-25 Today's Date: 06/24/2014   History of Present Illness  Patient is a 69 yo female admitted 06/24/14 now s/p Lt TKA.  PMH:  HTN, anemia, Rt TKA, Lt shoulder surgery  Clinical Impression  Patient presents with problems listed below.  Will benefit from acute PT to maximize functional independence prior to discharge home with family.    Follow Up Recommendations Home health PT;Supervision/Assistance - 24 hour    Equipment Recommendations  None recommended by PT    Recommendations for Other Services       Precautions / Restrictions Precautions Precautions: Knee Precaution Booklet Issued: Yes (comment) Precaution Comments: Reviewed precautions with patient, her sister, and her son Restrictions Weight Bearing Restrictions: Yes LLE Weight Bearing: Weight bearing as tolerated      Mobility  Bed Mobility Overal bed mobility: Needs Assistance Bed Mobility: Supine to Sit     Supine to sit: Supervision     General bed mobility comments: Verbal cues for technique.  Patient able to move to sitting position with no physical assist.  Supervision for safety only.  Transfers Overall transfer level: Needs assistance Equipment used: Rolling walker (2 wheeled) Transfers: Sit to/from UGI CorporationStand;Stand Pivot Transfers Sit to Stand: Min assist Stand pivot transfers: Min assist       General transfer comment: Verbal cues for hand placement and technique.  Assist for balance/safety.  Patient able to take several pivot steps to chair - heavy reliance on UE's on RW.  Ambulation/Gait                Stairs            Wheelchair Mobility    Modified Rankin (Stroke Patients Only)       Balance                                             Pertinent Vitals/Pain Pain Assessment: No/denies pain (States LLE is numb)    Home Living Family/patient expects to be discharged  to:: Private residence Living Arrangements: Children Available Help at Discharge: Family;Available 24 hours/day (2 sons and sister) Type of Home: House Home Access: Stairs to enter Entrance Stairs-Rails: Doctor, general practiceight;Left Entrance Stairs-Number of Steps: 4 Home Layout: Two level Home Equipment: Environmental consultantWalker - 2 wheels      Prior Function Level of Independence: Independent         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 adviserHand Dominance        Extremity/Trunk Assessment   Upper Extremity Assessment: Overall WFL for tasks assessed           Lower Extremity Assessment: LLE deficits/detail   LLE Deficits / Details: Decreased strength and ROM post-op  Cervical / Trunk Assessment: Normal  Communication   Communication: No difficulties  Cognition Arousal/Alertness: Awake/alert Behavior During Therapy: WFL for tasks assessed/performed Overall Cognitive Status: Within Functional Limits for tasks assessed                      General Comments      Exercises Total Joint Exercises Ankle Circles/Pumps: AROM;Both;10 reps;Seated Quad Sets: AROM;Left;10 reps;Seated      Assessment/Plan    PT Assessment Patient needs continued PT services  PT Diagnosis Difficulty walking;Abnormality of gait   PT Problem List Decreased strength;Decreased range of motion;Decreased activity tolerance;Decreased balance;Decreased mobility;Decreased  knowledge of use of DME;Decreased knowledge of precautions  PT Treatment Interventions DME instruction;Gait training;Stair training;Functional mobility training;Therapeutic activities;Therapeutic exercise;Patient/family education   PT Goals (Current goals can be found in the Care Plan section) Acute Rehab PT Goals Patient Stated Goal: To return home PT Goal Formulation: With patient/family Time For Goal Achievement: 07/01/14 Potential to Achieve Goals: Good    Frequency 7X/week   Barriers to discharge        Co-evaluation               End of  Session Equipment Utilized During Treatment: Gait belt Activity Tolerance: Patient tolerated treatment well Patient left: in chair;with call bell/phone within reach;with family/visitor present Nurse Communication: Mobility status         Time: 1610-9604 PT Time Calculation (min) (ACUTE ONLY): 24 min   Charges:   PT Evaluation $Initial PT Evaluation Tier I: 1 Procedure PT Treatments $Therapeutic Activity: 8-22 mins   PT G Codes:        Vena Austria 07/21/2014, 5:21 PM Durenda Hurt. Renaldo Fiddler, [facility] Acute Rehab Services Pager [phone]

## 2014-06-24 NOTE — Anesthesia Procedure Notes (Addendum)
Anesthesia Regional Block:  Adductor canal block  Pre-Anesthetic Checklist: ,, timeout performed, Correct Patient, Correct Site, Correct Laterality, Correct Procedure, Correct Position, site marked, Risks and benefits discussed,  Surgical consent,  Pre-op evaluation,  At surgeon's request and post-op pain management  Laterality: Left and Lower  Prep: Maximum Sterile Barrier Precautions used and chloraprep       Needles:  Injection technique: Single-shot  Needle Type: Echogenic Stimulator Needle     Needle Length: 10cm 10 cm Needle Gauge: 21 and 21 G    Additional Needles:  Procedures: ultrasound guided (picture in chart) Adductor canal block Narrative:  Start time: 06/24/2014 9:00 AM End time: 06/24/2014 9:18 AM Anesthesiologist: Sebastian AcheMANNY, THEODORE  Additional Notes: Adductor canal block with US guidance, 30ml .5% marcaine with epi, sedated with fentanyl and versed, talked to patient throughout, no complications, consent and laterality verified   Procedure Name: Intubation Date/Time: 06/24/2014 9:52 AM Performed by: Leonel Ramsay'LAUGHLIN, Pantera Winterrowd H Pre-anesthesia Checklist: Patient identified, Timeout performed, Emergency Drugs available, Suction available and Patient being monitored Patient Re-evaluated:Patient Re-evaluated prior to inductionOxygen Delivery Method: Circle system utilized Preoxygenation: Pre-oxygenation with 100% oxygen Intubation Type: IV induction Ventilation: Mask ventilation without difficulty Laryngoscope Size: Mac and 3 Grade View: Grade I Tube type: Oral Tube size: 7.0 mm Number of attempts: 1 Airway Equipment and Method: Stylet Placement Confirmation: ETT inserted through vocal cords under direct vision,  positive ETCO2 and breath sounds checked- equal and bilateral Secured at: 21 cm Tube secured with: Tape Dental Injury: Teeth and Oropharynx as per pre-operative assessment

## 2014-06-24 NOTE — Plan of Care (Signed)
Problem: Consults Goal: Diagnosis- Total Joint Replacement Primary Total Knee Left     

## 2014-06-24 NOTE — Transfer of Care (Signed)
Immediate Anesthesia Transfer of Care Note  Patient: Angelica Evans  Procedure(s) Performed: Procedure(s): TOTAL KNEE ARTHROPLASTY (Left)  Patient Location: PACU  Anesthesia Type:General  Level of Consciousness: awake, alert  and oriented  Airway & Oxygen Therapy: Patient Spontanous Breathing and Patient connected to nasal cannula oxygen  Post-op Assessment: Report given to RN and Post -op Vital signs reviewed and stable  Post vital signs: Reviewed and stable  Last Vitals:  Filed Vitals:   06/24/14 1136  BP:   Pulse: 77  Temp: 36.3 C  Resp: 17    Complications: No apparent anesthesia complications

## 2014-06-25 ENCOUNTER — Encounter (HOSPITAL_COMMUNITY): Payer: Self-pay | Admitting: Orthopedic Surgery

## 2014-06-25 LAB — BASIC METABOLIC PANEL
ANION GAP: 6 (ref 5–15)
BUN: 14 mg/dL (ref 6–23)
CHLORIDE: 103 mmol/L (ref 96–112)
CO2: 27 mmol/L (ref 19–32)
CREATININE: 1.03 mg/dL (ref 0.50–1.10)
Calcium: 8.5 mg/dL (ref 8.4–10.5)
GFR calc Af Amer: 63 mL/min — ABNORMAL LOW (ref >90)
GFR calc non Af Amer: 55 mL/min — ABNORMAL LOW (ref >90)
Glucose, Bld: 122 mg/dL — ABNORMAL HIGH (ref 70–99)
POTASSIUM: 3.9 mmol/L (ref 3.5–5.1)
SODIUM: 136 mmol/L (ref 135–145)

## 2014-06-25 LAB — CBC
HCT: 29.8 % — ABNORMAL LOW (ref 36.0–46.0)
Hemoglobin: 9.9 g/dL — ABNORMAL LOW (ref 12.0–15.0)
MCH: 28 pg (ref 26.0–34.0)
MCHC: 33.2 g/dL (ref 30.0–36.0)
MCV: 84.2 fL (ref 78.0–100.0)
Platelets: 247 10*3/uL (ref 150–400)
RBC: 3.54 MIL/uL — AB (ref 3.87–5.11)
RDW: 13.5 % (ref 11.5–15.5)
WBC: 9.2 10*3/uL (ref 4.0–10.5)

## 2014-06-25 LAB — URINALYSIS, ROUTINE W REFLEX MICROSCOPIC
Bilirubin Urine: NEGATIVE
Glucose, UA: NEGATIVE mg/dL
HGB URINE DIPSTICK: NEGATIVE
Ketones, ur: NEGATIVE mg/dL
Leukocytes, UA: NEGATIVE
Nitrite: NEGATIVE
PH: 5.5 (ref 5.0–8.0)
PROTEIN: NEGATIVE mg/dL
SPECIFIC GRAVITY, URINE: 1.022 (ref 1.005–1.030)
Urobilinogen, UA: 0.2 mg/dL (ref 0.0–1.0)

## 2014-06-25 MED ORDER — OXYCODONE HCL ER 10 MG PO T12A: 10.0000 mg | EXTENDED_RELEASE_TABLET | Freq: Two times a day (BID) | ORAL | Status: DC

## 2014-06-25 MED ORDER — ENOXAPARIN SODIUM 40 MG/0.4ML ~~LOC~~ SOLN: 40.0000 mg | SUBCUTANEOUS | Status: DC

## 2014-06-25 MED ORDER — TIZANIDINE HCL 2 MG PO TABS: 2.0000 mg | ORAL_TABLET | Freq: Four times a day (QID) | ORAL | Status: DC | PRN

## 2014-06-25 MED ORDER — OXYCODONE HCL 5 MG PO TABS: 5.0000 mg | ORAL_TABLET | ORAL | Status: DC | PRN

## 2014-06-25 NOTE — Progress Notes (Signed)
Physical Therapy Treatment Note  Clinical Impression:  Pt progressing towards PT goals.  Pt demonstrated min assist/supervision level function and safe stair navigation necessary for return home.  Went over safe car transfers with patient and son.  Current plan to d/c home with intermittent supervision and home health remains appropriate per medical clearance and d/c.     06/25/14 1204  PT Visit Information  Last PT Received On 06/25/14  Assistance Needed +1  History of Present Illness Patient is a 69 yo female admitted 06/24/14 now s/p Lt TKA.  PMH:  HTN, anemia, Rt TKA, Lt shoulder surgery  PT Time Calculation  PT Start Time (ACUTE ONLY) 1204  PT Stop Time (ACUTE ONLY) 1232  PT Time Calculation (min) (ACUTE ONLY) 28 min  Subjective Data  Patient Stated Goal To return home  Precautions  Precautions Knee  Precaution Comments Reviewed precautions with patient and son  Restrictions  Weight Bearing Restrictions Yes  LLE Weight Bearing WBAT  Pain Assessment  Pain Assessment 0-10  Pain Score 4  Pain Location L knee  Pain Descriptors / Indicators Aching;Sore  Pain Intervention(s) Monitored during session  Cognition  Arousal/Alertness Awake/alert  Behavior During Therapy WFL for tasks assessed/performed  Overall Cognitive Status Within Functional Limits for tasks assessed  Bed Mobility  Overal bed mobility Needs Assistance  Bed Mobility Supine to Sit  Supine to sit Supervision  General bed mobility comments Received on CPM, demonstrated no difficulty with bed mobility, still used rails minimally  Transfers  Overall transfer level Needs assistance  Equipment used Rolling walker (2 wheeled)  Transfers Sit to/from Stand  Sit to Stand Supervision  General transfer comment Demonstrated safe hand placement and transfer technique  Ambulation/Gait  Ambulation/Gait assistance Min guard  Ambulation Distance (Feet) 175 Feet  Assistive device Rolling walker (2 wheeled)  Gait  Pattern/deviations Step-to pattern;Decreased stride length;Antalgic  Gait velocity Slow  Gait velocity interpretation Below normal speed for age/gender  General Gait Details max v/c's for transition to step through gait pattern, still demonstrated step to and lacked gait fluidity  Stairs Yes  Stairs assistance Min guard  Stair Management One rail Left;Sideways  Number of Stairs 8  General stair comments Demonstrated navigation of stairs necessary to get ot bedroom once in home; will need assistance to transfer walker at home  Balance  Overall balance assessment Needs assistance  Standing balance support Bilateral upper extremity supported;During functional activity  Standing balance-Leahy Scale Fair  Standing balance comment Still relied heavily on B UE during functional activities  General Comments  General comments (skin integrity, edema, etc.) Needed break after stair navigation, reported she was sore/fatigued from earlier treatment  Exercises  Exercises Total Joint  Total Joint Exercises  Ankle Circles/Pumps AROM;Both;10 reps;Seated  Quad Sets 5 reps;AROM;Seated;Left  Long Arc Quad AROM;10 reps;Left;Seated  Knee Flexion AROM;Left;Seated;10 reps  Goniometric ROM AROM flex/ext: 12-45  PT - End of Session  Equipment Utilized During Treatment Gait belt  Activity Tolerance Patient tolerated treatment well  Patient left in chair;with call bell/phone within reach;with family/visitor present  Nurse Communication Mobility status  PT - Assessment/Plan  PT Plan Current plan remains appropriate  PT Frequency (ACUTE ONLY) 7X/week  Follow Up Recommendations Home health PT;Supervision/Assistance - 24 hour  PT equipment None recommended by PT  PT Goal Progression  Progress towards PT goals Progressing toward goals   Vella RaringKailee Tavious Griesinger, SPT (student physical therapist) Office phone: [phone]

## 2014-06-25 NOTE — Op Note (Signed)
TOTAL KNEE REPLACEMENT OPERATIVE NOTE:  06/24/2014  9:53 AM  PATIENT:  Angelica Evans  69 y.o. female  PRE-OPERATIVE DIAGNOSIS:  primary osteoarthritis left knee  POST-OPERATIVE DIAGNOSIS:  primary osteoarthritis left knee  PROCEDURE:  Procedure(s): TOTAL KNEE ARTHROPLASTY  SURGEON:  Surgeon(s): Dannielle HuhSteve Solei Wubben, MD  PHYSICIAN ASSISTANT: Altamese CabalMaurice Jones, [facility]  ANESTHESIA:   general  DRAINS: Hemovac  SPECIMEN: None  COUNTS:  Correct  TOURNIQUET:   Total Tourniquet Time Documented: Thigh (Left) - 58 minutes Total: Thigh (Left) - 58 minutes   DICTATION:  Indication for procedure:    The patient is a 69 y.o. female who has failed conservative treatment for primary osteoarthritis left knee.  Informed consent was obtained prior to anesthesia. The risks versus benefits of the operation were explain and in a way the patient can, and did, understand.   On the implant demand matching protocol, this patient scored 10.  Therefore, this patient did" "did not receive a polyethylene insert with vitamin E which is a high demand implant.  Description of procedure:     The patient was taken to the operating room and placed under anesthesia.  The patient was positioned in the usual fashion taking care that all body parts were adequately padded and/or protected.  I foley catheter was not placed.  A tourniquet was applied and the leg prepped and draped in the usual sterile fashion.  The extremity was exsanguinated with the esmarch and tourniquet inflated to 350 mmHg.  Pre-operative range of motion was normal.  The knee was in 10 degree of significant valgus.  A midline incision approximately 6-7 inches long was made with a #10 blade.  A new blade was used to make a parapatellar arthrotomy going 2-3 cm into the quadriceps tendon, over the patella, and alongside the medial aspect of the patellar tendon.  A synovectomy was then performed with the #10 blade and forceps. I then elevated the deep MCL off the  medial tibial metaphysis subperiosteally around to the semimembranosus attachment.    I everted the patella and used calipers to measure patellar thickness.  I used the reamer to ream down to appropriate thickness to recreate the native thickness.  I then removed excess bone with the rongeur and sagittal saw.  I used the appropriately sized template and drilled the three lug holes.  I then put the trial in place and measured the thickness with the calipers to ensure recreation of the native thickness.  The trial was then removed and the patella subluxed and the knee brought into flexion.  A homan retractor was place to retract and protect the patella and lateral structures.  A Z-retractor was place medially to protect the medial structures.  The extra-medullary alignment system was used to make cut the tibial articular surface perpendicular to the anamotic axis of the tibia and in 3 degrees of posterior slope.  The cut surface and alignment jig was removed.  I then used the intramedullary alignment guide to make a 4 valgus cut on the distal femur.  I then marked out the epicondylar axis on the distal femur.  The posterior condylar axis measured 5 degrees.  I then used the anterior referencing sizer and measured the femur to be a size 7.  The 4-In-1 cutting block was screwed into place in external rotation matching the posterior condylar angle, making our cuts perpendicular to the epicondylar axis.  Anterior, posterior and chamfer cuts were made with the sagittal saw.  The cutting block and cut pieces were  removed.  A lamina spreader was placed in 90 degrees of flexion.  The ACL, PCL, menisci, and posterior condylar osteophytes were removed.  A 11 mm spacer blocked was found to offer good flexion and extension gap balance after moderate in degree releasing.   The scoop retractor was then placed and the femoral finishing block was pinned in place.  The small sagittal saw was used as well as the lug drill to  finish the femur.  The block and cut surfaces were removed and the medullary canal hole filled with autograft bone from the cut pieces.  The tibia was delivered forward in deep flexion and external rotation.  A size E tray was selected and pinned into place centered on the medial 1/3 of the tibial tubercle.  The reamer and keel was used to prepare the tibia through the tray.    I then trialed with the size 7 femur, size E tibia, a 11 mm insert and the 32 patella.  I had excellent flexion/extension gap balance, excellent patella tracking.  Flexion was full and beyond 120 degrees; extension was zero.  These components were chosen and the staff opened them to me on the back table while the knee was lavaged copiously and the cement mixed.  The soft tissue was infiltrated with 60cc of exparel 1.3% through a 21 gauge needle.  I cemented in the components and removed all excess cement.  The polyethylene tibial component was snapped into place and the knee placed in extension while cement was hardening.  The capsule was infilltrated with 30cc of .25% Marcaine with epinephrine.  A hemovac was place in the joint exiting superolaterally.  A pain pump was place superomedially superficial to the arthrotomy.  Once the cement was hard, the tourniquet was let down.  Hemostasis was obtained.  The arthrotomy was closed with figure-8 #1 vicryl sutures.  The deep soft tissues were closed with #0 vicryls and the subcuticular layer closed with a running #2-0 vicryl.  The skin was reapproximated and closed with skin staples.  The wound was dressed with xeroform, 4 x4's, 2 ABD sponges, a single layer of webril and a TED stocking.   The patient was then awakened, extubated, and taken to the recovery room in stable condition.  BLOOD LOSS:  300cc DRAINS: 1 hemovac, 1 pain catheter COMPLICATIONS:  None.  PLAN OF CARE: Admit to inpatient   PATIENT DISPOSITION:  PACU - hemodynamically stable.   Delay start of Pharmacological  VTE agent (>24hrs) due to surgical blood loss or risk of bleeding:  not applicable  Please fax a copy of this op note to my office at [phone] (please only include page 1 and 2 of the Case Information op note)

## 2014-06-25 NOTE — Progress Notes (Signed)
Discharge instructions and prescriptions reviewed with patient and son. Questions or concerns denied at this time. [facility] and has all necessary equipment delivered to her at this time. IV removed with no complications and VSS. Patient discharged via wheelchair with dressing supplies, personal belongings, prescriptions, and discharge packet.

## 2014-06-25 NOTE — Evaluation (Signed)
Occupational Therapy Evaluation Patient Details Name: Angelica Evans MRN: [id number] DOB: Dec 27, 1945 Today's Date: 06/25/2014    History of Present Illness Patient is a 69 yo female admitted 06/24/14 now s/p Lt TKA.  PMH:  HTN, anemia, Rt TKA, Lt shoulder surgery   Clinical Impression   Patient is s/p L TKA surgery resulting in functional limitations due to the deficits listed below (see OT problem list).  Patient will benefit from skilled OT acutely to increase independence and safety with ADLS to allow discharge HHOT and tub seat.  Pt offered 3n1 and declined     Follow Up Recommendations  Home health OT;Supervision/Assistance - 24 hour (CM made aware of need Angelica Pikes(Susan))    Equipment Recommendations  Tub/shower seat    Recommendations for Other Services       Precautions / Restrictions Precautions Precautions: Knee Precaution Comments: Reviewed precautions with patient Restrictions Weight Bearing Restrictions: Yes LLE Weight Bearing: Weight bearing as tolerated      Mobility Bed Mobility Overal bed mobility: Needs Assistance Bed Mobility: Supine to Sit     Supine to sit: Supervision     General bed mobility comments: sitting on EOB on arrival. See PT notes for bed mobility  Transfers Overall transfer level: Needs assistance Equipment used: Rolling walker (2 wheeled) Transfers: Sit to/from Stand Sit to Stand: Supervision         General transfer comment: v/c's given for hand placement for safe transfer sit to stand with RW    Balance Overall balance assessment: Needs assistance         Standing balance support: No upper extremity supported Standing balance-Leahy Scale: Good Standing balance comment: Heavy reliance on B UE during functional activity and with sit to stand transfer                            ADL Overall ADL's : At baseline                                             Vision     Perception     Praxis       Pertinent Vitals/Pain Pain Assessment: 0-10 Pain Score: 3  Pain Location: Lt knee Pain Descriptors / Indicators: Sore Pain Intervention(s): Monitored during session     Hand Dominance Right   Extremity/Trunk Assessment Upper Extremity Assessment Upper Extremity Assessment: Overall WFL for tasks assessed   Lower Extremity Assessment Lower Extremity Assessment: Defer to PT evaluation   Cervical / Trunk Assessment Cervical / Trunk Assessment: Normal   Communication Communication Communication: No difficulties   Cognition Arousal/Alertness: Awake/alert Behavior During Therapy: WFL for tasks assessed/performed Overall Cognitive Status: Within Functional Limits for tasks assessed                     General Comments       Exercises       Shoulder Instructions      Home Living Family/patient expects to be discharged to:: Private residence Living Arrangements: Children Available Help at Discharge: Family;Available 24 hours/day Type of Home: House Home Access: Stairs to enter Enter[employer] CorporationEntrance Stairs-Number of Steps: 4 Entrance Stairs-Rails: Right;Left Home Layout: Two level Alternate Level Stairs-Number of Steps: 8 Alternate Level Stairs-Rails: Right Bathroom Shower/Tub: Tub/shower unit;Curtain   Bathroom Toilet: Standard     Home Equipment: Environmental consultantWalker - 2 wheels  Additional Comments: pt is requesting a shower seat and educaed on not taking a bath. Pt prefers bath instead of showers. pt will have (A) from sister and son upon d/c. Pt plans to sponge bath until MD follow up.       Prior Functioning/Environment Level of Independence: Independent        Comments: Works as NT    OT Diagnosis: Acute pain   OT Problem List: Impaired balance (sitting and/or standing);Decreased safety awareness;Decreased knowledge of use of DME or AE;Decreased knowledge of precautions;Pain   OT Treatment/Interventions: Self-care/ADL training;Therapeutic exercise;DME and/or AE  instruction;Therapeutic activities;Balance training;Patient/family education    OT Goals(Current goals can be found in the care plan section) Acute Rehab OT Goals Patient Stated Goal: To return home OT Goal Formulation: With patient Time For Goal Achievement: 07/09/14 Potential to Achieve Goals: Good  OT Frequency: Min 2X/week   Barriers to D/C:            Co-evaluation              End of Session Equipment Utilized During Treatment: Rolling walker;Gait belt CPM Left Knee CPM Left Knee: Off Additional Comments: Bone Foam Nurse Communication: Mobility status;Precautions  Activity Tolerance: Patient tolerated treatment well Patient left: in chair;with call bell/phone within reach   Time: 0830-0850 OT Time Calculation (min): 20 min Charges:  OT General Charges $OT Visit: 1 Procedure OT Evaluation $Initial OT Evaluation Tier I: 1 Procedure G-Codes:    Angelica Evans 2014-07-23, 10:18 AM Pager: [phone]

## 2014-06-25 NOTE — Progress Notes (Signed)
Physical Therapy Treatment Patient Details Name: Kehaulani Fruin MRN: [id number] DOB: Sep 15, 1945 Today's Date: 06/25/2014    History of Present Illness Patient is a 69 yo female admitted 06/24/14 now s/p Lt TKA.  PMH:  HTN, anemia, Rt TKA, Lt shoulder surgery    PT Comments    Pt progressing towards PT goals.  Pt demonstrated safe ambulation with RW with min guard level assist.  Will assess stairs necessary to enter house and bedroom later today.  Current plan for d/c home with intermittent supervision and home health PT remains appropriate per medical d/c and clearance.    Follow Up Recommendations  Home health PT;Supervision/Assistance - 24 hour     Equipment Recommendations  None recommended by PT    Recommendations for Other Services       Precautions / Restrictions Precautions Precautions: Knee Precaution Comments: Reviewed precautions with patient Restrictions Weight Bearing Restrictions: Yes LLE Weight Bearing: Weight bearing as tolerated    Mobility  Bed Mobility Overal bed mobility: Needs Assistance Bed Mobility: Supine to Sit     Supine to sit: Supervision     General bed mobility comments: sitting on EOB on arrival. See PT notes for bed mobility  Transfers Overall transfer level: Needs assistance Equipment used: Rolling walker (2 wheeled) Transfers: Sit to/from Stand Sit to Stand: Supervision         General transfer comment: v/c's given for hand placement for safe transfer sit to stand with RW  Ambulation/Gait Ambulation/Gait assistance: Min guard Ambulation Distance (Feet): 150 Feet Assistive device: Rolling walker (2 wheeled) Gait Pattern/deviations: Step-to pattern;Decreased stride length;Antalgic   Gait velocity interpretation: Below normal speed for age/gender General Gait Details: v/c's for heel toe gait pattern and transition to step through gait patter and managment of RW   Stairs            Wheelchair Mobility    Modified  Rankin (Stroke Patients Only)       Balance Overall balance assessment: Needs assistance         Standing balance support: No upper extremity supported Standing balance-Leahy Scale: Good Standing balance comment: Heavy reliance on B UE during functional activity and with sit to stand transfer                    Cognition Arousal/Alertness: Awake/alert Behavior During Therapy: WFL for tasks assessed/performed Overall Cognitive Status: Within Functional Limits for tasks assessed                      Exercises      General Comments General comments (skin integrity, edema, etc.): surgerical site intact under ted hose      Pertinent Vitals/Pain Pain Assessment: 0-10 Pain Score: 3  Pain Location: Lt knee Pain Descriptors / Indicators: Sore Pain Intervention(s): Monitored during session    Home Living Family/patient expects to be discharged to:: Private residence Living Arrangements: Children Available Help at Discharge: Family;Available 24 hours/day Type of Home: House Home Access: Stairs to enter Entrance Stairs-Rails: Right;Left Home Layout: Two level Home Equipment: Walker - 2 wheels Additional Comments: pt is requesting a shower seat and educaed on not taking a bath. Pt prefers bath instead of showers. pt will have (A) from sister and son upon d/c. Pt plans to sponge bath until MD follow up.     Prior Function Level of Independence: Independent      Comments: Works as NT   PT Goals (current goals can now be found in the  care plan section) Acute Rehab PT Goals Patient Stated Goal: To return home Progress towards PT goals: Progressing toward goals    Frequency  7X/week    PT Plan Current plan remains appropriate    Co-evaluation             End of Session Equipment Utilized During Treatment: Gait belt Activity Tolerance: Patient tolerated treatment well Patient left: in bed;with family/visitor present (With OT in room)     Time:  [id number] PT Time Calculation (min) (ACUTE ONLY): 20 min  Charges:  $Gait Training: 8-22 mins                    G Codes:      Jadan Rouillard 06/25/2014, 11:41 AM  Vella RaringKailee Harsh Trulock, SPT (student physical therapist) Office phone: [phone]

## 2014-06-25 NOTE — Progress Notes (Signed)
SPORTS MEDICINE AND JOINT REPLACEMENT  Georgena Spurling, MD   Altamese Cabal, PA-C [address] [place], [place], [place]  [address]                             [phone]   PROGRESS NOTE  Subjective:  negative for Chest Pain  negative for Shortness of Breath  negative for Nausea/Vomiting   negative for Calf Pain  negative for Bowel Movement   Tolerating Diet: yes         Patient reports pain as 5 on 0-10 scale.    Objective: Vital signs in last 24 hours:   Patient Vitals for the past 24 hrs:  BP Temp Temp src Pulse Resp SpO2 Height Weight  06/25/14 0557 115/60 mmHg 97.8 F (36.6 C) Oral 64 - 95 % - -  06/24/14 2030 117/69 mmHg 98.9 F (37.2 C) Axillary 82 - 94 % - -  06/24/14 1549 - - - - 16 99 % - -  06/24/14 1322 115/61 mmHg 97.6 F (36.4 C) - 64 15 99 % - -  06/24/14 1300 123/62 mmHg 97.8 F (36.6 C) - 66 14 99 % - -  06/24/14 1245 (!) 133/58 mmHg - - 63 13 100 % - -  06/24/14 1230 (!) 120/56 mmHg - - 62 14 99 % - -  06/24/14 1215 124/60 mmHg - - 67 15 100 % - -  06/24/14 1200 137/71 mmHg - - 71 16 100 % - -  06/24/14 1145 (!) 134/56 mmHg - - 80 19 97 % - -  06/24/14 1136 120/61 mmHg 97.3 F (36.3 C) - 77 17 99 % - -  06/24/14 0925 - - - 65 13 100 % - -  06/24/14 0923 - - - 68 15 100 % - -  06/24/14 0921 - - - 61 13 100 % - -  06/24/14 0919 - - - 62 12 100 % - -  06/24/14 0917 - - - (!) 59 12 100 % - -  06/24/14 0915 - - - 64 (!) 21 100 % - -  06/24/14 0811 (!) 135/56 mmHg 98.3 F (36.8 C) Oral 62 16 99 %  (1.6 m) 75.439 kg (166 lb 5 oz)    {1959:LAST@   Intake/Output from previous day:   03/28 0701 - 03/29 0700 In: 2290 [P.O.:240; I.V.:2050] Out: 25    Intake/Output this shift:       Intake/Output      03/28 0701 - 03/29 0700 03/29 0701 - 03/30 0700   P.O. 240    I.V. (mL/kg) 2050 (27.2)    Total Intake(mL/kg) 2290 (30.4)    Urine (mL/kg/hr) 0    Blood 25    Total Output 25     Net +2265          Urine Occurrence 6 x       LABORATORY  DATA:  Recent Labs  06/24/14 1433 06/25/14 0525  WBC 10.0 9.2  HGB 12.1 9.9*  HCT 37.6 29.8*  PLT 213 247    Recent Labs  06/24/14 1433 06/25/14 0525  NA  --  136  K  --  3.9  CL  --  103  CO2  --  27  BUN  --  14  CREATININE 1.06 1.03  GLUCOSE  --  122*  CALCIUM  --  8.5   Lab Results  Component Value Date   INR 0.97 06/12/2014  INR 0.96 10/08/2010    Examination:  General appearance: alert, cooperative and no distress Extremities: Homans sign is negative, no sign of DVT  Wound Exam: clean, dry, intact   Drainage:  Scant/small amount Serosanguinous exudate  Motor Exam: EHL and FHL Intact  Sensory Exam: Deep Peroneal normal   Assessment:    1 Day Post-Op  Procedure(s) (LRB): TOTAL KNEE ARTHROPLASTY (Left)  ADDITIONAL DIAGNOSIS:  Active Problems:   S/P total knee arthroplasty  Acute Blood Loss Anemia   Plan: Physical Therapy as ordered Weight Bearing as Tolerated (WBAT)  DVT Prophylaxis:  Lovenox  DISCHARGE PLAN: Home  DISCHARGE NEEDS: HHPT, CPM, Walker and 3-in-1 comode seat         Angelica Evans 06/25/2014, 7:17 AM

## 2014-06-25 NOTE — Care Management Note (Signed)
CARE MANAGEMENT NOTE 06/25/2014  Patient:  Flinchum,Gidget   Account Number:  [id number]  Date Initiated:  06/25/2014  Documentation initiated by:  Vance PeperBRADY,Jaquaya Coyle  Subjective/Objective Assessment:   69 yr old admitted with osteoarthritis of left kne. Patient underwent a left total knee arthroplasty.     Action/Plan:   CM spoke with patient concerning ome halth and DME needs at discharge. [facility], no changes. has RW. Patient has family support at discharge.   Anticipated DC Date:  06/26/2014   Anticipated DC Plan:  HOME W HOME HEALTH SERVICES      DC Planning Services  CM consult      PAC Choice  DURABLE MEDICAL EQUIPMENT  HOME HEALTH   Choice offered to / List presented to:  C-1 Patient   DME arranged  3-N-1  CPM      DME agency  [employer]     HH arranged  HH-2 PT  HH-3 OT      [facility]   Status of service:  Completed, signed off Medicare Important Message given?  NA - LOS <3 / Initial given by admissions (If response is "NO", the following Medicare IM given date fields will be blank) Date Medicare IM given:   Medicare IM given by:   Date Additional Medicare IM given:   Additional Medicare IM given by:    Discharge Disposition:  HOME W HOME HEALTH SERVICES  Per UR Regulation:  Reviewed for med. necessity/level of care/duration of stay  If discussed at Long Length of Stay Meetings, dates discussed:

## 2014-06-25 NOTE — Discharge Instructions (Signed)

## 2014-07-03 ENCOUNTER — Ambulatory Visit (HOSPITAL_COMMUNITY)
Admission: RE | Admit: 2014-07-03 | Discharge: 2014-07-03 | Disposition: A | Discharge status: Home / Self Care | Payer: Medicare Other | Source: Ambulatory Visit | Attending: Orthopedic Surgery | Admitting: Orthopedic Surgery

## 2014-07-03 ENCOUNTER — Other Ambulatory Visit (HOSPITAL_COMMUNITY): Payer: Self-pay | Admitting: Orthopedic Surgery

## 2014-07-03 DIAGNOSIS — Z96652 Presence of left artificial knee joint: Secondary | ICD-10-CM

## 2014-07-03 DIAGNOSIS — R609 Edema, unspecified: Secondary | ICD-10-CM

## 2014-07-03 DIAGNOSIS — M79605 Pain in left leg: Secondary | ICD-10-CM | POA: Diagnosis present

## 2014-07-11 NOTE — Discharge Summary (Signed)
SPORTS MEDICINE & JOINT REPLACEMENT   Georgena Spurling, MD   Altamese Cabal, PA-C [address] [place], [place], [place]  [address]                             [phone]  PATIENT ID: Angelica Evans        MRN:  [id number]          DOB/AGE: 06-13-1945 / 69 y.o.    DISCHARGE SUMMARY  ADMISSION DATE:    06/24/2014 DISCHARGE DATE:  06/25/2014  ADMISSION DIAGNOSIS: primary osteoarthritis left knee    DISCHARGE DIAGNOSIS:  primary osteoarthritis left knee    ADDITIONAL DIAGNOSIS: Active Problems:   S/P total knee arthroplasty  Past Medical History  Diagnosis Date  . Complication of anesthesia   . PONV (postoperative nausea and vomiting)   . Family history of adverse reaction to anesthesia     makes sister nauseated  . Hypertension   . Heart murmur   . GERD (gastroesophageal reflux disease)   . Anemia     PROCEDURE: Procedure(s): TOTAL KNEE ARTHROPLASTY on 06/24/2014  CONSULTS:     HISTORY:  See H&P in chart  HOSPITAL COURSE:  Zhane Donlan is a 69 y.o. admitted on 06/24/2014 and found to have a diagnosis of primary osteoarthritis left knee.  After appropriate laboratory studies were obtained  they were taken to the operating room on 06/24/2014 and underwent Procedure(s): TOTAL KNEE ARTHROPLASTY.   They were given perioperative antibiotics:  Anti-infectives    Start     Dose/Rate Route Frequency Ordered Stop   06/24/14 1600  ceFAZolin (ANCEF) IVPB 2 g/50 mL premix     2 g 100 mL/hr over 30 Minutes Intravenous Every 6 hours 06/24/14 1347 06/24/14 2146   06/24/14 0600  ceFAZolin (ANCEF) IVPB 2 g/50 mL premix     2 g 100 mL/hr over 30 Minutes Intravenous On call to O.R. 06/23/14 1406 06/24/14 0955    .  Tolerated the procedure well.  Placed with a foley intraoperatively.  Given Ofirmev at induction and for 48 hours.    POD# 1: Vital signs were stable.  Patient denied Chest pain, shortness of breath, or calf pain.  Patient was started on Lovenox 30 mg subcutaneously twice daily  at 8am.  Consults to PT, OT, and care management were made.  The patient was weight bearing as tolerated.  CPM was placed on the operative leg 0-90 degrees for 6-8 hours a day.  Incentive spirometry was taught.  Dressing was changed.  Hemovac was discontinued.      POD #2, Continued  PT for ambulation and exercise program.  IV saline locked.  O2 discontinued.    The remainder of the hospital course was dedicated to ambulation and strengthening.   The patient was discharged on post op day 1 in  Good condition.  Blood products given:none  DIAGNOSTIC STUDIES: Recent vital signs: No data found.      Recent laboratory studies: No results for input(s): WBC, HGB, HCT, PLT in the last 168 hours. No results for input(s): NA, K, CL, CO2, BUN, CREATININE, GLUCOSE, CALCIUM in the last 168 hours. Lab Results  Component Value Date   INR 0.97 06/12/2014   INR 0.96 10/08/2010     Recent Radiographic Studies :  Dg Chest 2 View  06/12/2014   CLINICAL DATA:  Preop for knee replacement surgery.  EXAM: CHEST  2 VIEW  COMPARISON:  October 08, 2010.  FINDINGS: The heart size and mediastinal contours are within normal limits. Both lungs are clear. Degenerative disc disease is seen involving the mid thoracic spine.  IMPRESSION: No active cardiopulmonary disease.   Electronically Signed   By: Lupita Raider, M.D.   On: 06/12/2014 16:40   US Venous Img Lower Unilateral Left  07/03/2014   CLINICAL DATA:  Left lower extremity pain and edema. History of left total knee replacement (06/24/2014). Evaluate for DVT.  EXAM: LEFT LOWER EXTREMITY VENOUS DOPPLER ULTRASOUND  TECHNIQUE: Gray-scale sonography with graded compression, as well as color Doppler and duplex ultrasound were performed to evaluate the lower extremity deep venous systems from the level of the common femoral vein and including the common femoral, femoral, profunda femoral, popliteal and calf veins including the posterior tibial, peroneal and gastrocnemius  veins when visible. The superficial great saphenous vein was also interrogated. Spectral Doppler was utilized to evaluate flow at rest and with distal augmentation maneuvers in the common femoral, femoral and popliteal veins.  COMPARISON:  None.  FINDINGS: Contralateral Common Femoral Vein: Respiratory phasicity is normal and symmetric with the symptomatic side. No evidence of thrombus. Normal compressibility.  Common Femoral Vein: No evidence of thrombus. Normal compressibility, respiratory phasicity and response to augmentation.  Saphenofemoral Junction: No evidence of thrombus. Normal compressibility and flow on color Doppler imaging.  Profunda Femoral Vein: No evidence of thrombus. Normal compressibility and flow on color Doppler imaging.  Femoral Vein: No evidence of thrombus. Normal compressibility, respiratory phasicity and response to augmentation.  Popliteal Vein: No evidence of thrombus. Normal compressibility, respiratory phasicity and response to augmentation.  Calf Veins: No evidence of thrombus. Normal compressibility and flow on color Doppler imaging.  Superficial Great Saphenous Vein: No evidence of thrombus. Normal compressibility and flow on color Doppler imaging.  Venous Reflux:  None.  Other Findings: Note is made of an approximately 4.2 x 1.6 x 3.7 cm serpiginous mixed echogenic fluid collection within the soft tissues about the posterior medial aspect of the knee.  IMPRESSION: 1. No evidence of DVT within the left lower extremity. 2. Incidental note made of an approximately 4.2 cm minimally complex suspected Baker's cyst.   Electronically Signed   By: Simonne Come M.D.   On: 07/03/2014 16:19    DISCHARGE INSTRUCTIONS: Discharge Instructions    CPM    Complete by:  As directed   Continuous passive motion machine (CPM):      Use the CPM from 0 to 90 for 6-8 hours per day.      You may increase by 10 per day.  You may break it up into 2 or 3 sessions per day.      Use CPM for 2 weeks or  until you are told to stop.     Call MD / Call 911    Complete by:  As directed   If you experience chest pain or shortness of breath, CALL 911 and be transported to the hospital emergency room.  If you develope a fever above 101 F, pus (white drainage) or increased drainage or redness at the wound, or calf pain, call your surgeon's office.     Change dressing    Complete by:  As directed   Change dressing on Wednesday, then change the dressing daily with sterile 4 x 4 inch gauze dressing and apply TED hose.  You may clean the incision with alcohol prior to redressing.     Constipation Prevention    Complete by:  As directed  Drink plenty of fluids.  Prune juice may be helpful.  You may use a stool softener, such as Colace (over the counter) 100 mg twice a day.  Use MiraLax (over the counter) for constipation as needed.     Diet - low sodium heart healthy    Complete by:  As directed      Do not put a pillow under the knee. Place it under the heel.    Complete by:  As directed      Driving restrictions    Complete by:  As directed   No driving for 6 weeks     Increase activity slowly as tolerated    Complete by:  As directed      Lifting restrictions    Complete by:  As directed   No lifting for 6 weeks     TED hose    Complete by:  As directed   Use stockings (TED hose) for 2 weeks on both leg(s).  You may remove them at night for sleeping.           DISCHARGE MEDICATIONS:     Medication List    STOP taking these medications        Omega 3 1000 MG Caps      TAKE these medications        amLODipine 5 MG tablet  Commonly known as:  NORVASC  Take 5 mg by mouth daily.     aspirin 325 MG tablet  Take 325 mg by mouth daily.     bumetanide 1 MG tablet  Commonly known as:  BUMEX  Take 0.5 mg by mouth daily as needed (for fluid).     Chromium 200 MCG Tabs  Take 200 mcg by mouth daily.     cyanocobalamin 1000 MCG/ML injection  Commonly known as:  (VITAMIN B-12)   Inject 1,000 mcg into the muscle every 30 (thirty) days.     enoxaparin 40 MG/0.4ML injection  Commonly known as:  LOVENOX  Inject 0.4 mLs (40 mg total) into the skin daily.     lisinopril-hydrochlorothiazide 20-12.5 MG per tablet  Commonly known as:  PRINZIDE,ZESTORETIC  Take 2 tablets by mouth daily.     nebivolol 5 MG tablet  Commonly known as:  BYSTOLIC  Take 5 mg by mouth daily.     omeprazole 20 MG capsule  Commonly known as:  PRILOSEC  Take 20 mg by mouth daily.     oxyCODONE 5 MG immediate release tablet  Commonly known as:  Oxy IR/ROXICODONE  Take 1-2 tablets (5-10 mg total) by mouth every 3 (three) hours as needed for breakthrough pain.     OxyCODONE 10 mg T12a 12 hr tablet  Commonly known as:  OXYCONTIN  Take 1 tablet (10 mg total) by mouth every 12 (twelve) hours.     potassium chloride 10 MEQ tablet  Commonly known as:  K-DUR  Take 10 mEq by mouth as needed.     promethazine 25 MG tablet  Commonly known as:  PHENERGAN  Take 12.5-25 mg by mouth every 6 (six) hours as needed for nausea or vomiting.     tiZANidine 2 MG tablet  Commonly known as:  ZANAFLEX  Take 1 tablet (2 mg total) by mouth every 6 (six) hours as needed.     vitamin C 500 MG tablet  Commonly known as:  ASCORBIC ACID  Take 500 mg by mouth daily.        FOLLOW UP VISIT:  Follow-up Information    Follow up with [facility].   Why:  Someone from [facility] will contact you concerning start date and time for therapies.   Contact information:   [address] [place] [address] [phone]       Follow up with Raymon Mutton, MD On 07/09/2014.   Specialty:  Orthopedic Surgery   Contact information:   [address] [place] [address] [phone]       DISPOSITION: HOME   CONDITION:  Good   Shameca Landen 07/11/2014, 11:46 AM

## 2016-10-23 IMAGING — CR DG CHEST 2V
2 series · 2 of 2 positions shown · non-contrast
Comparison: October 08, 2010.

CLINICAL DATA: Preop for knee replacement surgery.

EXAM:
CHEST  2 VIEW

[w chest pa]
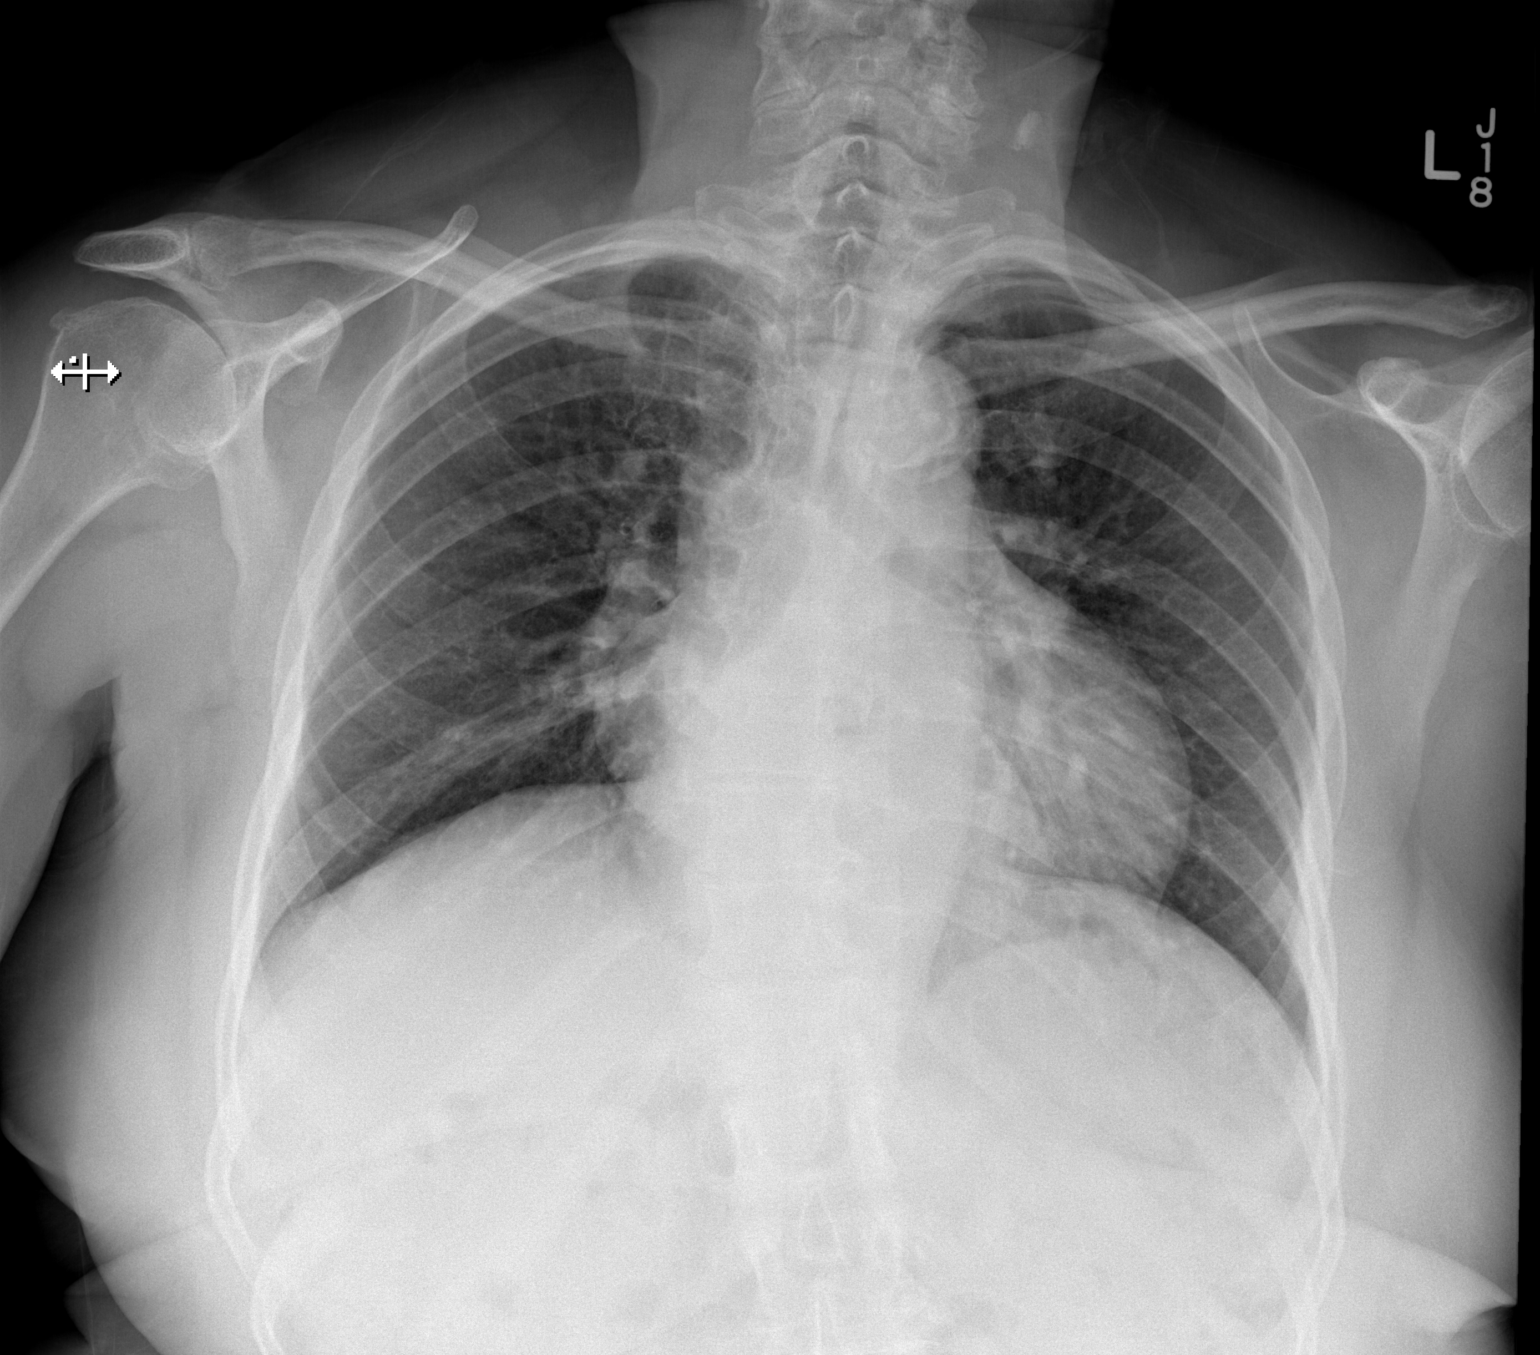

[w chest lat]
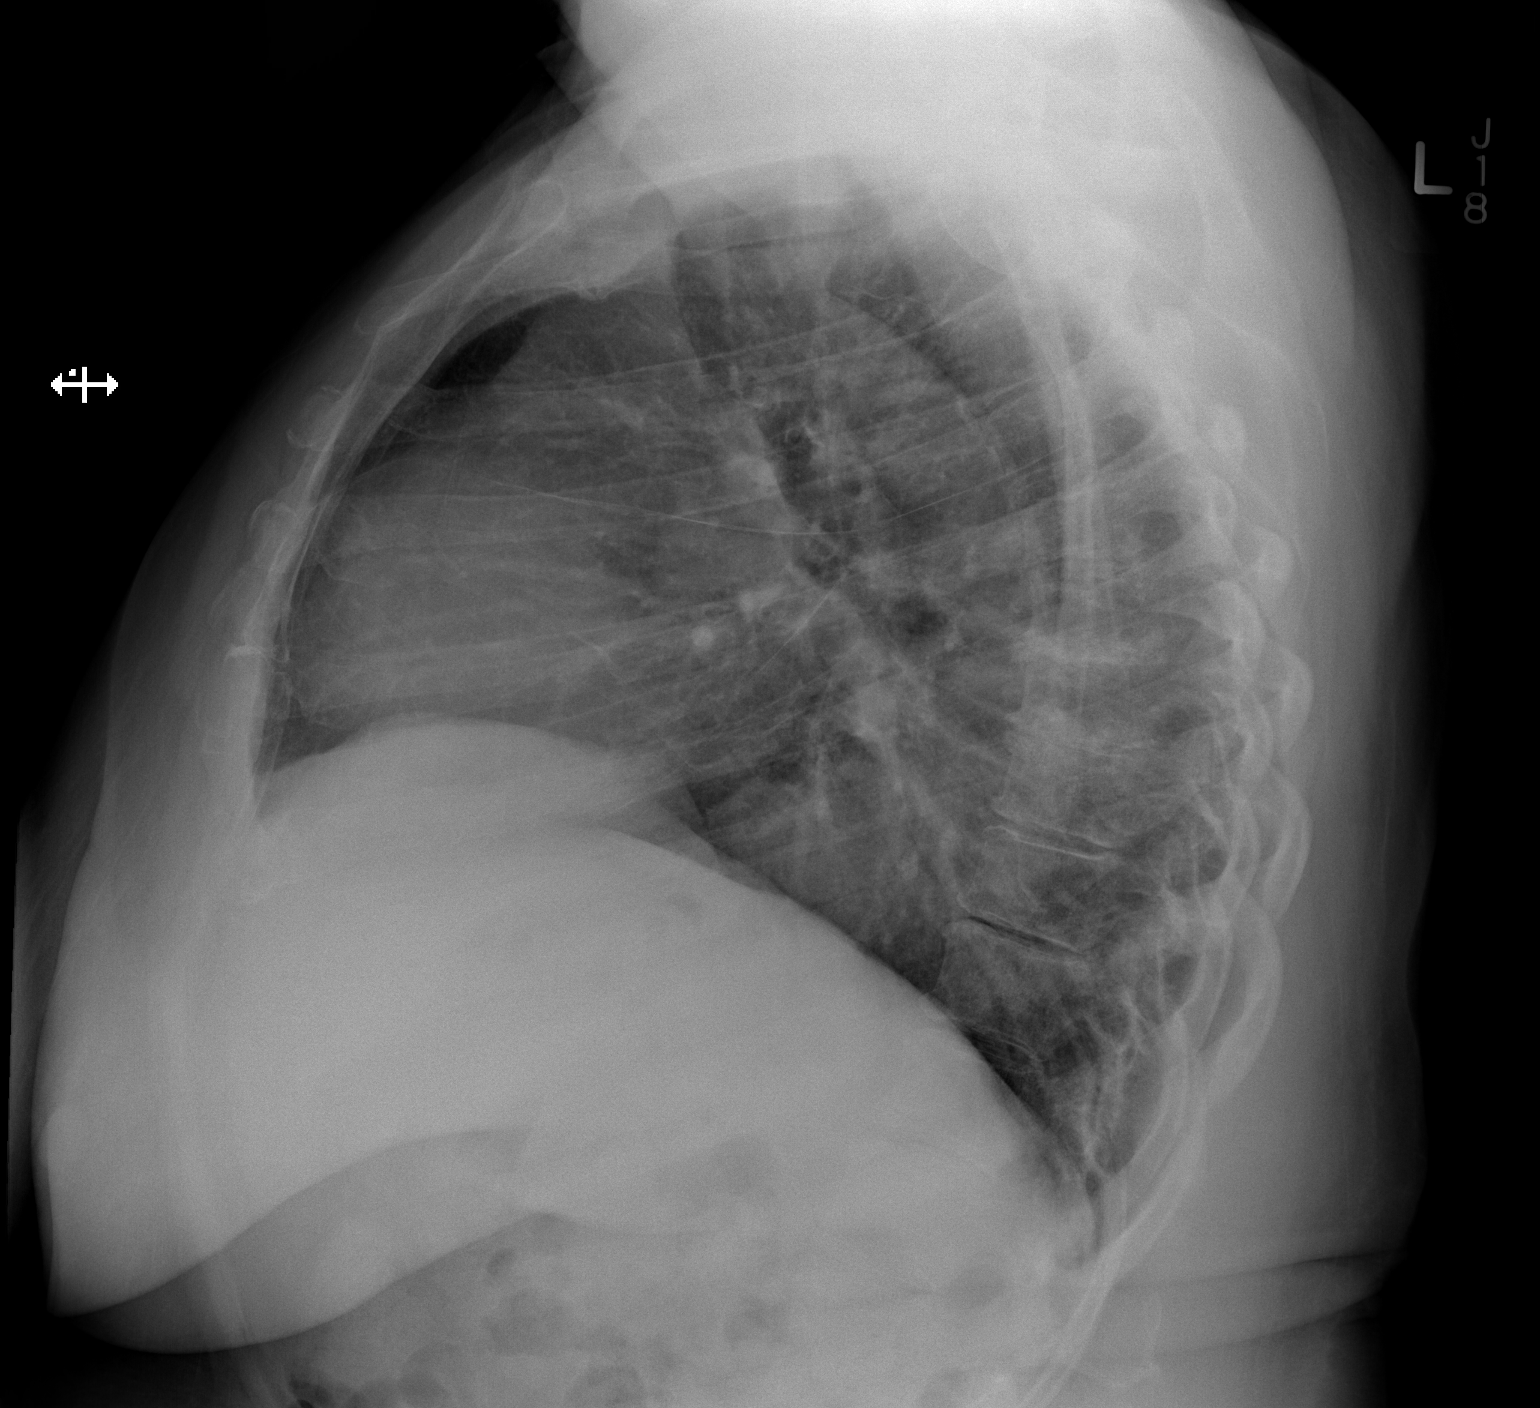

[2 of 2 positions shown; findings below may reference images not displayed]

FINDINGS: The heart size and mediastinal contours are within normal limits.
Both lungs are clear. Degenerative disc disease is seen involving
the mid thoracic spine.
IMPRESSION: No active cardiopulmonary disease.

## 2017-04-20 ENCOUNTER — Encounter: Payer: Self-pay | Admitting: Orthopedic Surgery

## 2017-04-20 ENCOUNTER — Ambulatory Visit: Payer: Medicare Other | Admitting: Orthopedic Surgery

## 2017-04-20 ENCOUNTER — Ambulatory Visit (INDEPENDENT_AMBULATORY_CARE_PROVIDER_SITE_OTHER): Payer: Medicare Other

## 2017-04-20 VITALS — BP 170/77 | HR 67 | Ht 61.0 in | Wt 166.0 lb

## 2017-04-20 DIAGNOSIS — M545 Low back pain, unspecified: Secondary | ICD-10-CM

## 2017-04-20 DIAGNOSIS — M4316 Spondylolisthesis, lumbar region: Secondary | ICD-10-CM

## 2017-04-20 DIAGNOSIS — M47816 Spondylosis without myelopathy or radiculopathy, lumbar region: Secondary | ICD-10-CM
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
## 2017-04-22 ENCOUNTER — Telehealth: Payer: Self-pay | Admitting: Orthopedic Surgery

## 2017-04-22 DIAGNOSIS — M545 Low back pain, unspecified: Secondary | ICD-10-CM

## 2017-04-22 NOTE — Telephone Encounter (Signed)
YES

## 2017-04-22 NOTE — Telephone Encounter (Signed)
Patient called to ask if Dr Romeo AppleHarrison can advise as to whether physical therapy may help her?  States should have asked at time of her office visit 04/20/17.  States would need to have it done at an [place] location if ordered.  Her ph# is [phone].
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
## 2017-04-22 NOTE — Telephone Encounter (Signed)
I called patient to advise faxed order to [facility] they will call her to make appt

## 2019-04-18 DIAGNOSIS — Z299 Encounter for prophylactic measures, unspecified: Secondary | ICD-10-CM | POA: Diagnosis not present

## 2019-04-18 DIAGNOSIS — N183 Chronic kidney disease, stage 3 unspecified: Secondary | ICD-10-CM | POA: Diagnosis not present

## 2019-04-18 DIAGNOSIS — J019 Acute sinusitis, unspecified: Secondary | ICD-10-CM | POA: Diagnosis not present

## 2019-04-18 DIAGNOSIS — I10 Essential (primary) hypertension: Secondary | ICD-10-CM | POA: Diagnosis not present

## 2019-04-18 DIAGNOSIS — Z789 Other specified health status: Secondary | ICD-10-CM | POA: Diagnosis not present

## 2019-05-22 DIAGNOSIS — Z789 Other specified health status: Secondary | ICD-10-CM | POA: Diagnosis not present

## 2019-05-22 DIAGNOSIS — I10 Essential (primary) hypertension: Secondary | ICD-10-CM | POA: Diagnosis not present

## 2019-05-22 DIAGNOSIS — E7800 Pure hypercholesterolemia, unspecified: Secondary | ICD-10-CM | POA: Diagnosis not present

## 2019-05-22 DIAGNOSIS — Z299 Encounter for prophylactic measures, unspecified: Secondary | ICD-10-CM | POA: Diagnosis not present

## 2019-06-27 DIAGNOSIS — R609 Edema, unspecified: Secondary | ICD-10-CM | POA: Diagnosis not present

## 2019-06-27 DIAGNOSIS — I351 Nonrheumatic aortic (valve) insufficiency: Secondary | ICD-10-CM | POA: Diagnosis not present

## 2019-07-10 DIAGNOSIS — I10 Essential (primary) hypertension: Secondary | ICD-10-CM | POA: Diagnosis not present

## 2019-07-10 DIAGNOSIS — Z299 Encounter for prophylactic measures, unspecified: Secondary | ICD-10-CM | POA: Diagnosis not present

## 2019-07-10 DIAGNOSIS — K219 Gastro-esophageal reflux disease without esophagitis: Secondary | ICD-10-CM | POA: Diagnosis not present

## 2019-07-27 DIAGNOSIS — M549 Dorsalgia, unspecified: Secondary | ICD-10-CM | POA: Diagnosis not present

## 2019-07-27 DIAGNOSIS — R609 Edema, unspecified: Secondary | ICD-10-CM | POA: Diagnosis not present

## 2019-08-27 DIAGNOSIS — I129 Hypertensive chronic kidney disease with stage 1 through stage 4 chronic kidney disease, or unspecified chronic kidney disease: Secondary | ICD-10-CM | POA: Diagnosis not present

## 2019-08-27 DIAGNOSIS — N183 Chronic kidney disease, stage 3 unspecified: Secondary | ICD-10-CM | POA: Diagnosis not present

## 2019-08-27 DIAGNOSIS — K219 Gastro-esophageal reflux disease without esophagitis: Secondary | ICD-10-CM | POA: Diagnosis not present

## 2019-09-26 DIAGNOSIS — E7849 Other hyperlipidemia: Secondary | ICD-10-CM | POA: Diagnosis not present

## 2019-09-26 DIAGNOSIS — I10 Essential (primary) hypertension: Secondary | ICD-10-CM | POA: Diagnosis not present

## 2019-09-26 DIAGNOSIS — Z299 Encounter for prophylactic measures, unspecified: Secondary | ICD-10-CM | POA: Diagnosis not present

## 2019-09-26 DIAGNOSIS — Z789 Other specified health status: Secondary | ICD-10-CM | POA: Diagnosis not present

## 2019-09-26 DIAGNOSIS — R197 Diarrhea, unspecified: Secondary | ICD-10-CM | POA: Diagnosis not present

## 2019-10-23 DIAGNOSIS — Z0000 Encounter for general adult medical examination without abnormal findings: Secondary | ICD-10-CM | POA: Diagnosis not present

## 2019-10-23 DIAGNOSIS — R5383 Other fatigue: Secondary | ICD-10-CM | POA: Diagnosis not present

## 2019-10-23 DIAGNOSIS — E559 Vitamin D deficiency, unspecified: Secondary | ICD-10-CM | POA: Diagnosis not present

## 2019-10-23 DIAGNOSIS — Z79899 Other long term (current) drug therapy: Secondary | ICD-10-CM | POA: Diagnosis not present

## 2019-10-23 DIAGNOSIS — Z299 Encounter for prophylactic measures, unspecified: Secondary | ICD-10-CM | POA: Diagnosis not present

## 2019-10-23 DIAGNOSIS — E7800 Pure hypercholesterolemia, unspecified: Secondary | ICD-10-CM | POA: Diagnosis not present

## 2019-10-23 DIAGNOSIS — I10 Essential (primary) hypertension: Secondary | ICD-10-CM | POA: Diagnosis not present

## 2019-10-23 DIAGNOSIS — Z7189 Other specified counseling: Secondary | ICD-10-CM | POA: Diagnosis not present

## 2019-11-21 DIAGNOSIS — I10 Essential (primary) hypertension: Secondary | ICD-10-CM | POA: Diagnosis not present

## 2019-11-21 DIAGNOSIS — H25812 Combined forms of age-related cataract, left eye: Secondary | ICD-10-CM | POA: Diagnosis not present

## 2019-11-27 DIAGNOSIS — I10 Essential (primary) hypertension: Secondary | ICD-10-CM | POA: Diagnosis not present

## 2019-11-27 DIAGNOSIS — E7849 Other hyperlipidemia: Secondary | ICD-10-CM | POA: Diagnosis not present

## 2020-01-08 DIAGNOSIS — Z1231 Encounter for screening mammogram for malignant neoplasm of breast: Secondary | ICD-10-CM | POA: Diagnosis not present

## 2020-01-18 DIAGNOSIS — Z961 Presence of intraocular lens: Secondary | ICD-10-CM | POA: Diagnosis not present

## 2020-01-18 DIAGNOSIS — H2512 Age-related nuclear cataract, left eye: Secondary | ICD-10-CM | POA: Diagnosis not present

## 2020-02-06 DIAGNOSIS — H2512 Age-related nuclear cataract, left eye: Secondary | ICD-10-CM | POA: Diagnosis not present

## 2020-02-13 DIAGNOSIS — Z961 Presence of intraocular lens: Secondary | ICD-10-CM | POA: Diagnosis not present

## 2020-02-26 DIAGNOSIS — N183 Chronic kidney disease, stage 3 unspecified: Secondary | ICD-10-CM | POA: Diagnosis not present

## 2020-02-26 DIAGNOSIS — I129 Hypertensive chronic kidney disease with stage 1 through stage 4 chronic kidney disease, or unspecified chronic kidney disease: Secondary | ICD-10-CM | POA: Diagnosis not present

## 2020-02-26 DIAGNOSIS — E7849 Other hyperlipidemia: Secondary | ICD-10-CM | POA: Diagnosis not present

## 2020-02-26 DIAGNOSIS — K219 Gastro-esophageal reflux disease without esophagitis: Secondary | ICD-10-CM | POA: Diagnosis not present

## 2020-03-05 DIAGNOSIS — M545 Low back pain, unspecified: Secondary | ICD-10-CM | POA: Diagnosis not present

## 2020-03-05 DIAGNOSIS — Z713 Dietary counseling and surveillance: Secondary | ICD-10-CM | POA: Diagnosis not present

## 2020-03-05 DIAGNOSIS — Z23 Encounter for immunization: Secondary | ICD-10-CM | POA: Diagnosis not present

## 2020-03-05 DIAGNOSIS — Z299 Encounter for prophylactic measures, unspecified: Secondary | ICD-10-CM | POA: Diagnosis not present

## 2020-03-05 DIAGNOSIS — I10 Essential (primary) hypertension: Secondary | ICD-10-CM | POA: Diagnosis not present

## 2020-03-28 DIAGNOSIS — E7849 Other hyperlipidemia: Secondary | ICD-10-CM | POA: Diagnosis not present

## 2020-03-28 DIAGNOSIS — N183 Chronic kidney disease, stage 3 unspecified: Secondary | ICD-10-CM | POA: Diagnosis not present

## 2020-03-28 DIAGNOSIS — K219 Gastro-esophageal reflux disease without esophagitis: Secondary | ICD-10-CM | POA: Diagnosis not present

## 2020-03-28 DIAGNOSIS — I129 Hypertensive chronic kidney disease with stage 1 through stage 4 chronic kidney disease, or unspecified chronic kidney disease: Secondary | ICD-10-CM | POA: Diagnosis not present

## 2020-04-15 DIAGNOSIS — Z299 Encounter for prophylactic measures, unspecified: Secondary | ICD-10-CM | POA: Diagnosis not present

## 2020-04-15 DIAGNOSIS — Z20822 Contact with and (suspected) exposure to covid-19: Secondary | ICD-10-CM | POA: Diagnosis not present

## 2020-04-15 DIAGNOSIS — R0981 Nasal congestion: Secondary | ICD-10-CM | POA: Diagnosis not present

## 2020-04-15 DIAGNOSIS — E7800 Pure hypercholesterolemia, unspecified: Secondary | ICD-10-CM | POA: Diagnosis not present

## 2020-04-15 DIAGNOSIS — I10 Essential (primary) hypertension: Secondary | ICD-10-CM | POA: Diagnosis not present

## 2020-05-19 DIAGNOSIS — M7061 Trochanteric bursitis, right hip: Secondary | ICD-10-CM | POA: Diagnosis not present

## 2020-05-19 DIAGNOSIS — G5703 Lesion of sciatic nerve, bilateral lower limbs: Secondary | ICD-10-CM | POA: Diagnosis not present

## 2020-05-19 DIAGNOSIS — M533 Sacrococcygeal disorders, not elsewhere classified: Secondary | ICD-10-CM | POA: Diagnosis not present

## 2020-05-21 DIAGNOSIS — M7061 Trochanteric bursitis, right hip: Secondary | ICD-10-CM | POA: Diagnosis not present

## 2020-05-21 DIAGNOSIS — M7062 Trochanteric bursitis, left hip: Secondary | ICD-10-CM | POA: Diagnosis not present

## 2020-05-21 DIAGNOSIS — M545 Low back pain, unspecified: Secondary | ICD-10-CM | POA: Diagnosis not present

## 2020-05-21 DIAGNOSIS — S336XXS Sprain of sacroiliac joint, sequela: Secondary | ICD-10-CM | POA: Diagnosis not present

## 2020-05-26 DIAGNOSIS — E7849 Other hyperlipidemia: Secondary | ICD-10-CM | POA: Diagnosis not present

## 2020-05-26 DIAGNOSIS — N183 Chronic kidney disease, stage 3 unspecified: Secondary | ICD-10-CM | POA: Diagnosis not present

## 2020-05-26 DIAGNOSIS — I129 Hypertensive chronic kidney disease with stage 1 through stage 4 chronic kidney disease, or unspecified chronic kidney disease: Secondary | ICD-10-CM | POA: Diagnosis not present

## 2020-05-29 DIAGNOSIS — S336XXS Sprain of sacroiliac joint, sequela: Secondary | ICD-10-CM | POA: Diagnosis not present

## 2020-05-29 DIAGNOSIS — M545 Low back pain, unspecified: Secondary | ICD-10-CM | POA: Diagnosis not present

## 2020-05-29 DIAGNOSIS — M7062 Trochanteric bursitis, left hip: Secondary | ICD-10-CM | POA: Diagnosis not present

## 2020-05-29 DIAGNOSIS — M7061 Trochanteric bursitis, right hip: Secondary | ICD-10-CM | POA: Diagnosis not present

## 2020-06-03 DIAGNOSIS — M7061 Trochanteric bursitis, right hip: Secondary | ICD-10-CM | POA: Diagnosis not present

## 2020-06-03 DIAGNOSIS — S336XXS Sprain of sacroiliac joint, sequela: Secondary | ICD-10-CM | POA: Diagnosis not present

## 2020-06-03 DIAGNOSIS — M7062 Trochanteric bursitis, left hip: Secondary | ICD-10-CM | POA: Diagnosis not present

## 2020-06-03 DIAGNOSIS — M545 Low back pain, unspecified: Secondary | ICD-10-CM | POA: Diagnosis not present

## 2020-06-05 DIAGNOSIS — M7062 Trochanteric bursitis, left hip: Secondary | ICD-10-CM | POA: Diagnosis not present

## 2020-06-05 DIAGNOSIS — S336XXS Sprain of sacroiliac joint, sequela: Secondary | ICD-10-CM | POA: Diagnosis not present

## 2020-06-05 DIAGNOSIS — M7061 Trochanteric bursitis, right hip: Secondary | ICD-10-CM | POA: Diagnosis not present

## 2020-06-05 DIAGNOSIS — M545 Low back pain, unspecified: Secondary | ICD-10-CM | POA: Diagnosis not present

## 2020-06-10 DIAGNOSIS — M7062 Trochanteric bursitis, left hip: Secondary | ICD-10-CM | POA: Diagnosis not present

## 2020-06-10 DIAGNOSIS — M7061 Trochanteric bursitis, right hip: Secondary | ICD-10-CM | POA: Diagnosis not present

## 2020-06-10 DIAGNOSIS — S336XXS Sprain of sacroiliac joint, sequela: Secondary | ICD-10-CM | POA: Diagnosis not present

## 2020-06-10 DIAGNOSIS — M545 Low back pain, unspecified: Secondary | ICD-10-CM | POA: Diagnosis not present

## 2020-06-12 DIAGNOSIS — M7062 Trochanteric bursitis, left hip: Secondary | ICD-10-CM | POA: Diagnosis not present

## 2020-06-12 DIAGNOSIS — M7061 Trochanteric bursitis, right hip: Secondary | ICD-10-CM | POA: Diagnosis not present

## 2020-06-12 DIAGNOSIS — S336XXS Sprain of sacroiliac joint, sequela: Secondary | ICD-10-CM | POA: Diagnosis not present

## 2020-06-12 DIAGNOSIS — M545 Low back pain, unspecified: Secondary | ICD-10-CM | POA: Diagnosis not present

## 2020-06-17 DIAGNOSIS — S336XXS Sprain of sacroiliac joint, sequela: Secondary | ICD-10-CM | POA: Diagnosis not present

## 2020-06-17 DIAGNOSIS — M545 Low back pain, unspecified: Secondary | ICD-10-CM | POA: Diagnosis not present

## 2020-06-17 DIAGNOSIS — M7061 Trochanteric bursitis, right hip: Secondary | ICD-10-CM | POA: Diagnosis not present

## 2020-06-17 DIAGNOSIS — M7062 Trochanteric bursitis, left hip: Secondary | ICD-10-CM | POA: Diagnosis not present

## 2020-06-19 DIAGNOSIS — M7061 Trochanteric bursitis, right hip: Secondary | ICD-10-CM | POA: Diagnosis not present

## 2020-06-19 DIAGNOSIS — M545 Low back pain, unspecified: Secondary | ICD-10-CM | POA: Diagnosis not present

## 2020-06-19 DIAGNOSIS — S336XXS Sprain of sacroiliac joint, sequela: Secondary | ICD-10-CM | POA: Diagnosis not present

## 2020-06-19 DIAGNOSIS — M7062 Trochanteric bursitis, left hip: Secondary | ICD-10-CM | POA: Diagnosis not present

## 2020-06-25 DIAGNOSIS — E7849 Other hyperlipidemia: Secondary | ICD-10-CM | POA: Diagnosis not present

## 2020-06-25 DIAGNOSIS — N183 Chronic kidney disease, stage 3 unspecified: Secondary | ICD-10-CM | POA: Diagnosis not present

## 2020-06-25 DIAGNOSIS — I10 Essential (primary) hypertension: Secondary | ICD-10-CM | POA: Diagnosis not present

## 2020-07-08 DIAGNOSIS — M545 Low back pain, unspecified: Secondary | ICD-10-CM | POA: Diagnosis not present

## 2020-07-08 DIAGNOSIS — S336XXS Sprain of sacroiliac joint, sequela: Secondary | ICD-10-CM | POA: Diagnosis not present

## 2020-07-08 DIAGNOSIS — M7062 Trochanteric bursitis, left hip: Secondary | ICD-10-CM | POA: Diagnosis not present

## 2020-07-08 DIAGNOSIS — M7061 Trochanteric bursitis, right hip: Secondary | ICD-10-CM | POA: Diagnosis not present

## 2020-07-11 DIAGNOSIS — M7061 Trochanteric bursitis, right hip: Secondary | ICD-10-CM | POA: Diagnosis not present

## 2020-07-11 DIAGNOSIS — M7062 Trochanteric bursitis, left hip: Secondary | ICD-10-CM | POA: Diagnosis not present

## 2020-07-11 DIAGNOSIS — M545 Low back pain, unspecified: Secondary | ICD-10-CM | POA: Diagnosis not present

## 2020-07-11 DIAGNOSIS — S336XXS Sprain of sacroiliac joint, sequela: Secondary | ICD-10-CM | POA: Diagnosis not present

## 2020-07-14 DIAGNOSIS — S336XXS Sprain of sacroiliac joint, sequela: Secondary | ICD-10-CM | POA: Diagnosis not present

## 2020-07-14 DIAGNOSIS — M7062 Trochanteric bursitis, left hip: Secondary | ICD-10-CM | POA: Diagnosis not present

## 2020-07-14 DIAGNOSIS — M545 Low back pain, unspecified: Secondary | ICD-10-CM | POA: Diagnosis not present

## 2020-07-14 DIAGNOSIS — M7061 Trochanteric bursitis, right hip: Secondary | ICD-10-CM | POA: Diagnosis not present

## 2020-07-16 DIAGNOSIS — S336XXS Sprain of sacroiliac joint, sequela: Secondary | ICD-10-CM | POA: Diagnosis not present

## 2020-07-16 DIAGNOSIS — M7061 Trochanteric bursitis, right hip: Secondary | ICD-10-CM | POA: Diagnosis not present

## 2020-07-16 DIAGNOSIS — M7062 Trochanteric bursitis, left hip: Secondary | ICD-10-CM | POA: Diagnosis not present

## 2020-07-16 DIAGNOSIS — M545 Low back pain, unspecified: Secondary | ICD-10-CM | POA: Diagnosis not present

## 2020-07-21 DIAGNOSIS — M7061 Trochanteric bursitis, right hip: Secondary | ICD-10-CM | POA: Diagnosis not present

## 2020-07-21 DIAGNOSIS — M545 Low back pain, unspecified: Secondary | ICD-10-CM | POA: Diagnosis not present

## 2020-07-21 DIAGNOSIS — S336XXS Sprain of sacroiliac joint, sequela: Secondary | ICD-10-CM | POA: Diagnosis not present

## 2020-07-21 DIAGNOSIS — M7062 Trochanteric bursitis, left hip: Secondary | ICD-10-CM | POA: Diagnosis not present

## 2020-07-23 DIAGNOSIS — M545 Low back pain, unspecified: Secondary | ICD-10-CM | POA: Diagnosis not present

## 2020-07-23 DIAGNOSIS — S336XXS Sprain of sacroiliac joint, sequela: Secondary | ICD-10-CM | POA: Diagnosis not present

## 2020-07-23 DIAGNOSIS — M7062 Trochanteric bursitis, left hip: Secondary | ICD-10-CM | POA: Diagnosis not present

## 2020-07-23 DIAGNOSIS — M7061 Trochanteric bursitis, right hip: Secondary | ICD-10-CM | POA: Diagnosis not present

## 2020-07-24 DIAGNOSIS — M2550 Pain in unspecified joint: Secondary | ICD-10-CM | POA: Diagnosis not present

## 2020-07-24 DIAGNOSIS — Z299 Encounter for prophylactic measures, unspecified: Secondary | ICD-10-CM | POA: Diagnosis not present

## 2020-07-24 DIAGNOSIS — I10 Essential (primary) hypertension: Secondary | ICD-10-CM | POA: Diagnosis not present

## 2020-07-24 DIAGNOSIS — Z87891 Personal history of nicotine dependence: Secondary | ICD-10-CM | POA: Diagnosis not present

## 2020-07-24 DIAGNOSIS — M549 Dorsalgia, unspecified: Secondary | ICD-10-CM | POA: Diagnosis not present

## 2020-07-26 DIAGNOSIS — I10 Essential (primary) hypertension: Secondary | ICD-10-CM | POA: Diagnosis not present

## 2020-07-26 DIAGNOSIS — E782 Mixed hyperlipidemia: Secondary | ICD-10-CM | POA: Diagnosis not present

## 2020-07-28 DIAGNOSIS — M7061 Trochanteric bursitis, right hip: Secondary | ICD-10-CM | POA: Diagnosis not present

## 2020-07-28 DIAGNOSIS — M7062 Trochanteric bursitis, left hip: Secondary | ICD-10-CM | POA: Diagnosis not present

## 2020-07-28 DIAGNOSIS — M545 Low back pain, unspecified: Secondary | ICD-10-CM | POA: Diagnosis not present

## 2020-07-28 DIAGNOSIS — S336XXS Sprain of sacroiliac joint, sequela: Secondary | ICD-10-CM | POA: Diagnosis not present

## 2020-07-30 DIAGNOSIS — M545 Low back pain, unspecified: Secondary | ICD-10-CM | POA: Diagnosis not present

## 2020-07-30 DIAGNOSIS — S336XXS Sprain of sacroiliac joint, sequela: Secondary | ICD-10-CM | POA: Diagnosis not present

## 2020-07-30 DIAGNOSIS — M7061 Trochanteric bursitis, right hip: Secondary | ICD-10-CM | POA: Diagnosis not present

## 2020-07-30 DIAGNOSIS — M7062 Trochanteric bursitis, left hip: Secondary | ICD-10-CM | POA: Diagnosis not present

## 2020-08-04 DIAGNOSIS — M7061 Trochanteric bursitis, right hip: Secondary | ICD-10-CM | POA: Diagnosis not present

## 2020-08-04 DIAGNOSIS — M545 Low back pain, unspecified: Secondary | ICD-10-CM | POA: Diagnosis not present

## 2020-08-04 DIAGNOSIS — M7062 Trochanteric bursitis, left hip: Secondary | ICD-10-CM | POA: Diagnosis not present

## 2020-08-04 DIAGNOSIS — S336XXS Sprain of sacroiliac joint, sequela: Secondary | ICD-10-CM | POA: Diagnosis not present

## 2020-08-26 DIAGNOSIS — I10 Essential (primary) hypertension: Secondary | ICD-10-CM | POA: Diagnosis not present

## 2020-09-10 DIAGNOSIS — I10 Essential (primary) hypertension: Secondary | ICD-10-CM | POA: Diagnosis not present

## 2020-09-25 DIAGNOSIS — E782 Mixed hyperlipidemia: Secondary | ICD-10-CM | POA: Diagnosis not present

## 2020-09-25 DIAGNOSIS — I10 Essential (primary) hypertension: Secondary | ICD-10-CM | POA: Diagnosis not present

## 2020-10-24 DIAGNOSIS — E7800 Pure hypercholesterolemia, unspecified: Secondary | ICD-10-CM | POA: Diagnosis not present

## 2020-10-24 DIAGNOSIS — E559 Vitamin D deficiency, unspecified: Secondary | ICD-10-CM | POA: Diagnosis not present

## 2020-10-24 DIAGNOSIS — Z79899 Other long term (current) drug therapy: Secondary | ICD-10-CM | POA: Diagnosis not present

## 2020-10-24 DIAGNOSIS — Z299 Encounter for prophylactic measures, unspecified: Secondary | ICD-10-CM | POA: Diagnosis not present

## 2020-10-24 DIAGNOSIS — Z0000 Encounter for general adult medical examination without abnormal findings: Secondary | ICD-10-CM | POA: Diagnosis not present

## 2020-10-24 DIAGNOSIS — I351 Nonrheumatic aortic (valve) insufficiency: Secondary | ICD-10-CM | POA: Diagnosis not present

## 2020-10-24 DIAGNOSIS — Z7189 Other specified counseling: Secondary | ICD-10-CM | POA: Diagnosis not present

## 2020-10-24 DIAGNOSIS — I10 Essential (primary) hypertension: Secondary | ICD-10-CM | POA: Diagnosis not present

## 2020-10-24 DIAGNOSIS — R5383 Other fatigue: Secondary | ICD-10-CM | POA: Diagnosis not present

## 2020-10-27 DIAGNOSIS — I351 Nonrheumatic aortic (valve) insufficiency: Secondary | ICD-10-CM | POA: Diagnosis not present

## 2020-10-31 DIAGNOSIS — E7800 Pure hypercholesterolemia, unspecified: Secondary | ICD-10-CM | POA: Diagnosis not present

## 2020-10-31 DIAGNOSIS — I351 Nonrheumatic aortic (valve) insufficiency: Secondary | ICD-10-CM | POA: Diagnosis not present

## 2020-10-31 DIAGNOSIS — I10 Essential (primary) hypertension: Secondary | ICD-10-CM | POA: Diagnosis not present

## 2020-10-31 DIAGNOSIS — Z299 Encounter for prophylactic measures, unspecified: Secondary | ICD-10-CM | POA: Diagnosis not present

## 2020-11-26 DIAGNOSIS — I10 Essential (primary) hypertension: Secondary | ICD-10-CM | POA: Diagnosis not present

## 2020-11-26 DIAGNOSIS — E782 Mixed hyperlipidemia: Secondary | ICD-10-CM | POA: Diagnosis not present

## 2020-12-12 DIAGNOSIS — Z23 Encounter for immunization: Secondary | ICD-10-CM | POA: Diagnosis not present

## 2020-12-12 DIAGNOSIS — K449 Diaphragmatic hernia without obstruction or gangrene: Secondary | ICD-10-CM | POA: Diagnosis not present

## 2020-12-12 DIAGNOSIS — R079 Chest pain, unspecified: Secondary | ICD-10-CM | POA: Diagnosis not present

## 2020-12-12 DIAGNOSIS — I517 Cardiomegaly: Secondary | ICD-10-CM | POA: Diagnosis not present

## 2020-12-12 DIAGNOSIS — I10 Essential (primary) hypertension: Secondary | ICD-10-CM | POA: Diagnosis not present

## 2020-12-12 DIAGNOSIS — Z299 Encounter for prophylactic measures, unspecified: Secondary | ICD-10-CM | POA: Diagnosis not present

## 2020-12-12 DIAGNOSIS — Z713 Dietary counseling and surveillance: Secondary | ICD-10-CM | POA: Diagnosis not present

## 2020-12-12 DIAGNOSIS — I259 Chronic ischemic heart disease, unspecified: Secondary | ICD-10-CM | POA: Diagnosis not present

## 2021-01-07 DIAGNOSIS — K449 Diaphragmatic hernia without obstruction or gangrene: Secondary | ICD-10-CM | POA: Diagnosis not present

## 2021-01-07 DIAGNOSIS — R079 Chest pain, unspecified: Secondary | ICD-10-CM | POA: Diagnosis not present

## 2021-01-07 DIAGNOSIS — I700 Atherosclerosis of aorta: Secondary | ICD-10-CM | POA: Diagnosis not present

## 2021-01-07 DIAGNOSIS — Z20822 Contact with and (suspected) exposure to covid-19: Secondary | ICD-10-CM | POA: Diagnosis not present

## 2021-01-07 DIAGNOSIS — Z7982 Long term (current) use of aspirin: Secondary | ICD-10-CM | POA: Diagnosis not present

## 2021-01-07 DIAGNOSIS — R9431 Abnormal electrocardiogram [ECG] [EKG]: Secondary | ICD-10-CM | POA: Diagnosis not present

## 2021-01-08 DIAGNOSIS — K449 Diaphragmatic hernia without obstruction or gangrene: Secondary | ICD-10-CM | POA: Diagnosis not present

## 2021-01-08 DIAGNOSIS — R079 Chest pain, unspecified: Secondary | ICD-10-CM | POA: Diagnosis not present

## 2021-01-12 DIAGNOSIS — I10 Essential (primary) hypertension: Secondary | ICD-10-CM | POA: Diagnosis not present

## 2021-01-12 DIAGNOSIS — Z299 Encounter for prophylactic measures, unspecified: Secondary | ICD-10-CM | POA: Diagnosis not present

## 2021-01-12 DIAGNOSIS — R079 Chest pain, unspecified: Secondary | ICD-10-CM | POA: Diagnosis not present

## 2021-01-12 DIAGNOSIS — K219 Gastro-esophageal reflux disease without esophagitis: Secondary | ICD-10-CM | POA: Diagnosis not present

## 2021-01-12 DIAGNOSIS — J029 Acute pharyngitis, unspecified: Secondary | ICD-10-CM | POA: Diagnosis not present

## 2021-01-26 DIAGNOSIS — M7750 Other enthesopathy of unspecified foot: Secondary | ICD-10-CM | POA: Diagnosis not present

## 2021-01-29 DIAGNOSIS — R5383 Other fatigue: Secondary | ICD-10-CM | POA: Diagnosis not present

## 2021-01-29 DIAGNOSIS — J019 Acute sinusitis, unspecified: Secondary | ICD-10-CM | POA: Diagnosis not present

## 2021-01-29 DIAGNOSIS — I10 Essential (primary) hypertension: Secondary | ICD-10-CM | POA: Diagnosis not present

## 2021-01-29 DIAGNOSIS — Z299 Encounter for prophylactic measures, unspecified: Secondary | ICD-10-CM | POA: Diagnosis not present

## 2021-03-19 DIAGNOSIS — Z1231 Encounter for screening mammogram for malignant neoplasm of breast: Secondary | ICD-10-CM | POA: Diagnosis not present

## 2021-03-27 DIAGNOSIS — M159 Polyosteoarthritis, unspecified: Secondary | ICD-10-CM | POA: Diagnosis not present

## 2021-03-27 DIAGNOSIS — E7800 Pure hypercholesterolemia, unspecified: Secondary | ICD-10-CM | POA: Diagnosis not present

## 2021-04-28 DIAGNOSIS — Z299 Encounter for prophylactic measures, unspecified: Secondary | ICD-10-CM | POA: Diagnosis not present

## 2021-04-28 DIAGNOSIS — E7800 Pure hypercholesterolemia, unspecified: Secondary | ICD-10-CM | POA: Diagnosis not present

## 2021-04-28 DIAGNOSIS — I10 Essential (primary) hypertension: Secondary | ICD-10-CM | POA: Diagnosis not present

## 2021-04-28 DIAGNOSIS — Z713 Dietary counseling and surveillance: Secondary | ICD-10-CM | POA: Diagnosis not present

## 2021-04-28 DIAGNOSIS — Z789 Other specified health status: Secondary | ICD-10-CM | POA: Diagnosis not present

## 2021-05-26 DIAGNOSIS — Z299 Encounter for prophylactic measures, unspecified: Secondary | ICD-10-CM | POA: Diagnosis not present

## 2021-05-26 DIAGNOSIS — E559 Vitamin D deficiency, unspecified: Secondary | ICD-10-CM | POA: Diagnosis not present

## 2021-05-26 DIAGNOSIS — Z79899 Other long term (current) drug therapy: Secondary | ICD-10-CM | POA: Diagnosis not present

## 2021-05-26 DIAGNOSIS — I10 Essential (primary) hypertension: Secondary | ICD-10-CM | POA: Diagnosis not present

## 2021-05-26 DIAGNOSIS — R5383 Other fatigue: Secondary | ICD-10-CM | POA: Diagnosis not present

## 2021-05-26 DIAGNOSIS — Z789 Other specified health status: Secondary | ICD-10-CM | POA: Diagnosis not present

## 2021-05-26 DIAGNOSIS — E7800 Pure hypercholesterolemia, unspecified: Secondary | ICD-10-CM | POA: Diagnosis not present

## 2021-05-26 DIAGNOSIS — Z0000 Encounter for general adult medical examination without abnormal findings: Secondary | ICD-10-CM | POA: Diagnosis not present

## 2021-06-25 DIAGNOSIS — E7800 Pure hypercholesterolemia, unspecified: Secondary | ICD-10-CM | POA: Diagnosis not present

## 2021-06-25 DIAGNOSIS — I10 Essential (primary) hypertension: Secondary | ICD-10-CM | POA: Diagnosis not present

## 2021-09-16 DIAGNOSIS — I10 Essential (primary) hypertension: Secondary | ICD-10-CM | POA: Diagnosis not present

## 2021-09-16 DIAGNOSIS — M542 Cervicalgia: Secondary | ICD-10-CM | POA: Diagnosis not present

## 2021-09-16 DIAGNOSIS — Z713 Dietary counseling and surveillance: Secondary | ICD-10-CM | POA: Diagnosis not present

## 2021-09-16 DIAGNOSIS — Z299 Encounter for prophylactic measures, unspecified: Secondary | ICD-10-CM | POA: Diagnosis not present
